# Patient Record
Sex: Male | Born: 1980 | Race: Black or African American | Hispanic: No | Marital: Single | Smoking: Current some day smoker
Health system: Southern US, Community
[De-identification: ages and names within clinical notes are randomized; demographics above are authoritative.]

## PROBLEM LIST (undated history)

## (undated) DIAGNOSIS — M549 Dorsalgia, unspecified: Secondary | ICD-10-CM

## (undated) DIAGNOSIS — I4891 Unspecified atrial fibrillation: Secondary | ICD-10-CM

---

## 2005-08-14 ENCOUNTER — Emergency Department: Payer: Self-pay | Admitting: Internal Medicine

## 2005-09-20 ENCOUNTER — Emergency Department: Payer: Self-pay | Admitting: Emergency Medicine

## 2007-04-24 ENCOUNTER — Emergency Department: Payer: Self-pay | Admitting: Emergency Medicine

## 2010-06-04 ENCOUNTER — Emergency Department: Payer: Self-pay | Admitting: Emergency Medicine

## 2010-06-13 ENCOUNTER — Emergency Department: Payer: Self-pay | Admitting: Internal Medicine

## 2010-08-30 ENCOUNTER — Observation Stay: Payer: Self-pay | Admitting: Internal Medicine

## 2011-09-26 ENCOUNTER — Emergency Department: Payer: Self-pay | Admitting: *Deleted

## 2012-06-27 ENCOUNTER — Emergency Department: Payer: Self-pay | Admitting: Emergency Medicine

## 2012-08-18 ENCOUNTER — Emergency Department: Payer: Self-pay | Admitting: Emergency Medicine

## 2013-10-15 ENCOUNTER — Emergency Department: Payer: Self-pay | Admitting: Emergency Medicine

## 2013-10-16 ENCOUNTER — Emergency Department: Payer: Self-pay | Admitting: Emergency Medicine

## 2015-07-16 ENCOUNTER — Encounter: Payer: Self-pay | Admitting: *Deleted

## 2015-07-16 ENCOUNTER — Emergency Department
Admission: EM | Admit: 2015-07-16 | Discharge: 2015-07-16 | Disposition: A | Discharge status: Home / Self Care | Payer: Self-pay | Attending: Emergency Medicine | Admitting: Emergency Medicine

## 2015-07-16 DIAGNOSIS — Y998 Other external cause status: Secondary | ICD-10-CM | POA: Insufficient documentation

## 2015-07-16 DIAGNOSIS — Z720 Tobacco use: Secondary | ICD-10-CM | POA: Insufficient documentation

## 2015-07-16 DIAGNOSIS — Y288XXA Contact with other sharp object, undetermined intent, initial encounter: Secondary | ICD-10-CM | POA: Insufficient documentation

## 2015-07-16 DIAGNOSIS — Y9301 Activity, walking, marching and hiking: Secondary | ICD-10-CM | POA: Insufficient documentation

## 2015-07-16 DIAGNOSIS — S61212A Laceration without foreign body of right middle finger without damage to nail, initial encounter: Secondary | ICD-10-CM | POA: Insufficient documentation

## 2015-07-16 DIAGNOSIS — Z23 Encounter for immunization: Secondary | ICD-10-CM | POA: Insufficient documentation

## 2015-07-16 DIAGNOSIS — S61214A Laceration without foreign body of right ring finger without damage to nail, initial encounter: Secondary | ICD-10-CM | POA: Insufficient documentation

## 2015-07-16 DIAGNOSIS — S61219A Laceration without foreign body of unspecified finger without damage to nail, initial encounter: Secondary | ICD-10-CM

## 2015-07-16 DIAGNOSIS — T07XXXA Unspecified multiple injuries, initial encounter: Secondary | ICD-10-CM

## 2015-07-16 DIAGNOSIS — Y92039 Unspecified place in apartment as the place of occurrence of the external cause: Secondary | ICD-10-CM | POA: Insufficient documentation

## 2015-07-16 DIAGNOSIS — S61210A Laceration without foreign body of right index finger without damage to nail, initial encounter: Secondary | ICD-10-CM | POA: Insufficient documentation

## 2015-07-16 HISTORY — DX: Dorsalgia, unspecified: M54.9

## 2015-07-16 MED ORDER — TETANUS-DIPHTH-ACELL PERTUSSIS 5-2.5-18.5 LF-MCG/0.5 IM SUSP
0.5000 mL | Freq: Once | INTRAMUSCULAR | Status: AC
  Administered 2015-07-16: 0.5 mL via INTRAMUSCULAR
  Filled 2015-07-16: qty 0.5

## 2015-07-16 MED ORDER — KETOROLAC TROMETHAMINE 10 MG PO TABS
20.0000 mg | ORAL_TABLET | Freq: Once | ORAL | Status: AC
  Administered 2015-07-16: 20 mg via ORAL
  Filled 2015-07-16: qty 2

## 2015-07-16 MED ORDER — LIDOCAINE HCL (PF) 1 % IJ SOLN
5.0000 mL | Freq: Once | INTRAMUSCULAR | Status: AC
  Administered 2015-07-16: 5 mL
  Filled 2015-07-16: qty 5

## 2015-07-16 NOTE — ED Notes (Signed)
Lac to right index and 3 rd finger from ash tray

## 2015-07-16 NOTE — Discharge Instructions (Signed)
Laceration Care, Adult A laceration is a cut that goes through all layers of the skin. The cut goes into the tissue beneath the skin. HOME CARE For stitches (sutures) or staples:  Keep the cut clean and dry.  If you have a bandage (dressing), change it at least once a day. Change the bandage if it gets wet or dirty, or as told by your doctor.  Wash the cut with soap and water 2 times a day. Rinse the cut with water. Pat it dry with a clean towel.  Put a thin layer of medicated cream on the cut as told by your doctor.  You may shower after the first 24 hours. Do not soak the cut in water until the stitches are removed.  Only take medicines as told by your doctor.  Have your stitches or staples removed as told by your doctor. For skin adhesive strips:  Keep the cut clean and dry.  Do not get the strips wet. You may take a bath, but be careful to keep the cut dry.  If the cut gets wet, pat it dry with a clean towel.  The strips will fall off on their own. Do not remove the strips that are still stuck to the cut. For wound glue:  You may shower or take baths. Do not soak or scrub the cut. Do not swim. Avoid heavy sweating until the glue falls off on its own. After a shower or bath, pat the cut dry with a clean towel.  Do not put medicine on your cut until the glue falls off.  If you have a bandage, do not put tape over the glue.  Avoid lots of sunlight or tanning lamps until the glue falls off. Put sunscreen on the cut for the first year to reduce your scar.  The glue will fall off on its own. Do not pick at the glue. You may need a tetanus shot if:  You cannot remember when you had your last tetanus shot.  You have never had a tetanus shot. If you need a tetanus shot and you choose not to have one, you may get tetanus. Sickness from tetanus can be serious. GET HELP RIGHT AWAY IF:   Your pain does not get better with medicine.  Your arm, hand, leg, or foot loses feeling  (numbness) or changes color.  Your cut is bleeding.  Your joint feels weak, or you cannot use your joint.  You have painful lumps on your body.  Your cut is red, puffy (swollen), or painful.  You have a red line on the skin near the cut.  You have yellowish-white fluid (pus) coming from the cut.  You have a fever.  You have a bad smell coming from the cut or bandage.  Your cut breaks open before or after stitches are removed.  You notice something coming out of the cut, such as wood or glass.  You cannot move a finger or toe. MAKE SURE YOU:   Understand these instructions.  Will watch your condition.  Will get help right away if you are not doing well or get worse. Document Released: 05/25/2008 Document Revised: 02/29/2012 Document Reviewed: 06/02/2011 [facility] Patient Information 2015 [place], [place]. This information is not intended to replace advice given to you by your health care provider. Make sure you discuss any questions you have with your health care provider.   Keep your wound clean, dry, and covered.  Wash only with soap & water. Return to the  ED in 10-12 days for suture removal.

## 2015-07-16 NOTE — ED Notes (Signed)
Lac to right fingers

## 2015-07-16 NOTE — ED Provider Notes (Signed)
[facility] Emergency Department Provider Note ____________________________________________  Time seen: 0800  I have reviewed the triage vital signs and the nursing notes.  HISTORY  Chief Complaint  Extremity Laceration  HPI Darryl Flynn is a 34 y.o. male reports to the ED for evaluation management of multiple lacerations sustained to the right fingers. He describes he was walking into his apartment, when he inadvertently slipped on his son's toy which was in the floor. This caused him to lunch forward and as he tried to grab the the bar, he knocked off a large glass ashtray. This actually shattered causing multiple cuts to his right index, middle, and ring fingers.He is unsure of his tetanus status at this time.  Past Medical History  Diagnosis Date  . Back ache    There are no active problems to display for this patient.  History reviewed. No pertinent past surgical history.  No current outpatient prescriptions on file.  Allergies Review of patient's allergies indicates no known allergies.  No family history on file.  Social History History  Substance Use Topics  . Smoking status: Current Every Day Smoker  . Smokeless tobacco: Not on file  . Alcohol Use: No   Review of Systems  Constitutional: Negative for fever. Eyes: Negative for visual changes. ENT: Negative for sore throat. Cardiovascular: Negative for chest pain. Respiratory: Negative for shortness of breath. Gastrointestinal: Negative for abdominal pain, vomiting and diarrhea. Genitourinary: Negative for dysuria. Musculoskeletal: Negative for back pain. Skin: Negative for rash. Multiple lacerations as above.  Neurological: Negative for headaches, focal weakness or numbness. ____________________________________________  PHYSICAL EXAM:  VITAL SIGNS: ED Triage Vitals  Enc Vitals Group     BP 07/16/15 0743 141/115 mmHg     Pulse Rate 07/16/15 0743 77     Resp 07/16/15 0743 20    Temp 07/16/15 0743 98.6 F (37 C)     Temp Source 07/16/15 0743 Oral     SpO2 07/16/15 0743 100 %     Weight 07/16/15 0743 150 lb (68.04 kg)     Height 07/16/15 0743  (1.753 m)     Head Cir --      Peak Flow --      Pain Score 07/16/15 0744 10     Pain Loc --      Pain Edu? --      Excl. in GC? --    Constitutional: Alert and oriented. Well appearing and in no distress. Eyes: Conjunctivae are normal. PERRL. Normal extraocular movements. ENT   Head: Normocephalic and atraumatic.   Nose: No congestion/rhinnorhea.   Mouth/Throat: Mucous membranes are moist.   Neck: Supple. No thyromegaly. Hematological/Lymphatic/Immunilogical: No cervical lymphadenopathy. Cardiovascular: Normal rate, regular rhythm.  Respiratory: Normal respiratory effort. No wheezes/rales/rhonchi. Gastrointestinal: Soft and nontender. No distention. Musculoskeletal: Nontender with normal range of motion in right hand. Normal composite fist.  Neurologic:  Normal gross sensation. Normal gait without ataxia. Normal speech and language. No gross focal neurologic deficits are appreciated. Skin:  Skin is warm, dry and intact. No rash noted. Multiple laceration to the tips of the right index and ring fingers. Bleeding controlled.  Psychiatric: Mood and affect are normal. Patient exhibits appropriate insight and judgment. ____________________________________________  PROCEDURES Tdap booster  LACERATION REPAIR Performed by: Lissa Hoard Authorized by: Lissa Hoard Consent: Verbal consent obtained. Risks and benefits: risks, benefits and alternatives were discussed Consent given by: patient Patient identity confirmed: provided demographic data Prepped and Draped in normal sterile fashion  Wound explored  Laceration Location: Right index/ring fingers  Laceration Length: index 1cm & 1cm / ring 2cm  No Foreign Bodies seen or palpated  Anesthesia: digital block to right 2nd &  4th digits  Local anesthetic: lidocaine 1% w/o epinephrine  Anesthetic total: 5 ml  Irrigation method: syringe  Amount of cleaning: standard  Skin closure: 5-0 nylon  Number of sutures: index = 4 / ring = 1  Technique: interrupted  Patient tolerance: 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 Unwilling to allow for direct infiltration after sub-optimal digital block anesthesia. The procedure was completed with no immediate complications. ____________________________________________  INITIAL IMPRESSION / ASSESSMENT AND PLAN / ED COURSE  Laceration repair using sutures for multiple lac to the fingers of the right hand. Wound care instructions given.  Return to the ED in 10-12 days for suture removal.  Return as needed for wound checks. ____________________________________________  FINAL CLINICAL IMPRESSION(S) / ED DIAGNOSES  Final diagnoses:  Multiple lacerations  Laceration of finger of right hand, initial encounter     Lissa Hoard, PA-C 07/16/15 1610  Phineas Semen, MD 07/16/15 1231

## 2015-07-31 ENCOUNTER — Emergency Department
Admission: EM | Admit: 2015-07-31 | Discharge: 2015-07-31 | Disposition: A | Discharge status: Home / Self Care | Payer: Self-pay | Attending: Emergency Medicine | Admitting: Emergency Medicine

## 2015-07-31 ENCOUNTER — Encounter: Payer: Self-pay | Admitting: Emergency Medicine

## 2015-07-31 DIAGNOSIS — Z720 Tobacco use: Secondary | ICD-10-CM | POA: Insufficient documentation

## 2015-07-31 DIAGNOSIS — Z4802 Encounter for removal of sutures: Secondary | ICD-10-CM | POA: Insufficient documentation

## 2015-07-31 NOTE — ED Notes (Signed)
Pt arrives with stitches to his left finger, index, PA removed all 5 stitches

## 2015-07-31 NOTE — Discharge Instructions (Signed)

## 2015-07-31 NOTE — ED Notes (Signed)
Here for suture removal

## 2015-07-31 NOTE — ED Provider Notes (Signed)
[facility] Emergency Department Provider Note  ____________________________________________  Time seen: Approximately 11:43 AM  I have reviewed the triage vital signs and the nursing notes.   HISTORY  Chief Complaint Suture / Staple Removal    HPI Darryl Flynn is a 34 y.o. male patient here for suture removal right index and ring finger. Patient denies any signs of secondary infection. He denies any pain. Patient denies any loss of sensation or loss of function of the fingers. Sutures were placed   Past Medical History  Diagnosis Date  . Back ache     There are no active problems to display for this patient.   History reviewed. No pertinent past surgical history.  No current outpatient prescriptions on file.  Allergies Review of patient's allergies indicates no known allergies.  No family history on file.  Social History Social History  Substance Use Topics  . Smoking status: Current Every Day Smoker  . Smokeless tobacco: None  . Alcohol Use: No    Review of Systems Constitutional: No fever/chills Eyes: No visual changes. ENT: No sore throat. Cardiovascular: Denies chest pain. Respiratory: Denies shortness of breath. Gastrointestinal: No abdominal pain.  No nausea, no vomiting.  No diarrhea.  No constipation. Genitourinary: Negative for dysuria. Musculoskeletal: Negative for back pain. Skin: Healed laceration second for finger right hand. Neurological: Negative for headaches, focal weakness or numbness. {10-point ROS otherwise negative.  ____________________________________________   PHYSICAL EXAM:  VITAL SIGNS: ED Triage Vitals  Enc Vitals Group     BP 07/31/15 1115 140/78 mmHg     Pulse Rate 07/31/15 1115 77     Resp 07/31/15 1115 20     Temp 07/31/15 1115 98 F (36.7 C)     Temp Source 07/31/15 1115 Oral     SpO2 07/31/15 1115 99 %     Weight 07/31/15 1115 150 lb (68.04 kg)     Height 07/31/15 1115  (1.753 m)      Head Cir --      Peak Flow --      Pain Score --      Pain Loc --      Pain Edu? --      Excl. in GC? --     Constitutional: Alert and oriented. Well appearing and in no acute distress. Eyes: Conjunctivae are normal. PERRL. EOMI. Head: Atraumatic. Nose: No congestion/rhinnorhea. Mouth/Throat: Mucous membranes are moist.  Oropharynx non-erythematous. Neck: No stridor.  No cervical spine tenderness to palpation. Hematological/Lymphatic/Immunilogical: No cervical lymphadenopathy. Cardiovascular: Normal rate, regular rhythm. Grossly normal heart sounds.  Good peripheral circulation. Respiratory: Normal respiratory effort.  No retractions. Lungs CTAB. Gastrointestinal: Soft and nontender. No distention. No abdominal bruits. No CVA tenderness. Musculoskeletal: No lower extremity tenderness nor edema.  No joint effusions. Neurologic:  Normal speech and language. No gross focal neurologic deficits are appreciated. No gait instability. Skin:  Healed laceration second fourth digit right hand. Psychiatric: Mood and affect are normal. Speech and behavior are normal.  ____________________________________________   LABS (all labs ordered are listed, but only abnormal results are displayed)  Labs Reviewed - No data to display ____________________________________________  EKG   ____________________________________________  RADIOLOGY   ____________________________________________   PROCEDURES  Procedure(s) performed: None  Critical Care performed: No  ____________________________________________   INITIAL IMPRESSION / ASSESSMENT AND PLAN / ED COURSE  Pertinent labs & imaging results that were available during my care of the patient were reviewed by me and considered in my medical decision making (see chart  for details).  Healed laceration second finger right hand. Suture removal without incident. Patient given home care instructions and advised to follow-up his condition  worsens. ____________________________________________   FINAL CLINICAL IMPRESSION(S) / ED DIAGNOSES  Final diagnoses:  Encounter for removal of sutures      Joni Reining, PA-C 07/31/15 1147  Governor Rooks, MD 07/31/15 [phone]

## 2015-10-19 ENCOUNTER — Encounter: Payer: Self-pay | Admitting: Emergency Medicine

## 2015-10-19 ENCOUNTER — Emergency Department
Admission: EM | Admit: 2015-10-19 | Discharge: 2015-10-19 | Disposition: A | Discharge status: Home / Self Care | Payer: Self-pay | Attending: Emergency Medicine | Admitting: Emergency Medicine

## 2015-10-19 DIAGNOSIS — K0889 Other specified disorders of teeth and supporting structures: Secondary | ICD-10-CM | POA: Insufficient documentation

## 2015-10-19 DIAGNOSIS — R1012 Left upper quadrant pain: Secondary | ICD-10-CM | POA: Insufficient documentation

## 2015-10-19 DIAGNOSIS — K029 Dental caries, unspecified: Secondary | ICD-10-CM | POA: Insufficient documentation

## 2015-10-19 DIAGNOSIS — Z720 Tobacco use: Secondary | ICD-10-CM | POA: Insufficient documentation

## 2015-10-19 DIAGNOSIS — K0381 Cracked tooth: Secondary | ICD-10-CM | POA: Insufficient documentation

## 2015-10-19 MED ORDER — PENICILLIN V POTASSIUM 250 MG PO TABS: 250.0000 mg | ORAL_TABLET | Freq: Four times a day (QID) | ORAL | Status: DC

## 2015-10-19 MED ORDER — TRAMADOL HCL 50 MG PO TABS: 50.0000 mg | ORAL_TABLET | Freq: Four times a day (QID) | ORAL | Status: DC | PRN

## 2015-10-19 NOTE — ED Notes (Signed)
Right lower dental pain.  Initially happened while in jail about one month ago.  Treated with antibiotics, so pain improved.  Pain returned yesterday. Also c/o left upper abdominal pain that is "like a gas bubble" that "takes (patient's breath away)" x 2 days.

## 2015-10-20 NOTE — ED Provider Notes (Signed)
[facility] Emergency Department Provider Note  ____________________________________________  Time seen: On arrival  I have reviewed the triage vital signs and the nursing notes.   HISTORY  Chief Complaint Dental Pain and Abdominal Pain    HPI Darryl Flynn is a 34 y.o. male who presents with complains of dental pain. He states his pain is primarily in the right lower posterior jaw but occasionally has pain in the left jaw as well. No fevers chills. No intraoral swelling. He also complains of occasionally having a twinge in his left upper abdomen but he denies any pain today. No nausea no vomiting no diarrhea.     Past Medical History  Diagnosis Date  . Back ache     There are no active problems to display for this patient.   History reviewed. No pertinent past surgical history.  Current Outpatient Rx  Name  Route  Sig  Dispense  Refill  . penicillin v potassium (VEETID) 250 MG tablet   Oral   Take 1 tablet (250 mg total) by mouth 4 (four) times daily.   28 tablet   0   . traMADol (ULTRAM) 50 MG tablet   Oral   Take 1 tablet (50 mg total) by mouth every 6 (six) hours as needed.   20 tablet   0     Allergies Review of patient's allergies indicates no known allergies.  No family history on file.  Social History Social History  Substance Use Topics  . Smoking status: Current Every Day Smoker  . Smokeless tobacco: None  . Alcohol Use: No    Review of Systems  Constitutional: Negative for fever. Eyes: Negative for visual changes. ENT: Negative for sore throat   Genitourinary: Negative for dysuria. Musculoskeletal: Negative for back pain. Skin: Negative for rash. Neurological: Negative for headaches or focal weakness   ____________________________________________   PHYSICAL EXAM:  VITAL SIGNS: ED Triage Vitals  Enc Vitals Group     BP 10/19/15 2206 127/75 mmHg     Pulse Rate 10/19/15 2203 86     Resp 10/19/15 2206 20      Temp 10/19/15 2203 98.5 F (36.9 C)     Temp Source 10/19/15 2203 Oral     SpO2 10/19/15 2206 98 %     Weight 10/19/15 2214 150 lb (68.04 kg)     Height 10/19/15 2214 5\' 9"  (1.753 m)     Head Cir --      Peak Flow --      Pain Score 10/19/15 2215 9     Pain Loc --      Pain Edu? --      Excl. in GC? --      Constitutional: Alert and oriented. Well appearing and in no distress. Eyes: Conjunctivae are normal.  ENT   Head: Normocephalic and atraumatic.   Mouth/Throat: Mucous membranes are moist. Positive dental caries, no dental abscesses. Fractured tooth noted at the site of pain Cardiovascular: Normal rate, regular rhythm.  Respiratory: Normal respiratory effort without tachypnea nor retractions.  Gastrointestinal: Soft and non-tender in all quadrants. No distention. There is no CVA tenderness.  Neurologic:  Normal speech and language. No gross focal neurologic deficits are appreciated. Skin:  Skin is warm, dry and intact. No rash noted. Psychiatric: Mood and affect are normal. Patient exhibits appropriate insight and judgment.  ____________________________________________    LABS (pertinent positives/negatives)  Labs Reviewed - No data to display  ____________________________________________     ____________________________________________  RADIOLOGY I have personally reviewed any xrays that were ordered on this patient: None  ____________________________________________   PROCEDURES  Procedure(s) performed: none   ____________________________________________   INITIAL IMPRESSION / ASSESSMENT AND PLAN / ED COURSE  Pertinent labs & imaging results that were available during my care of the patient were reviewed by me and considered in my medical decision making (see chart for details).  Penicillin and analgesics provided. Benign abdominal exam. Follow-up with dentist  ____________________________________________   FINAL CLINICAL  IMPRESSION(S) / ED DIAGNOSES  Final diagnoses:  Pain, dental     Jene Every, MD 10/20/15 [phone]

## 2016-02-07 ENCOUNTER — Emergency Department
Admission: EM | Admit: 2016-02-07 | Discharge: 2016-02-07 | Disposition: A | Discharge status: Home / Self Care | Payer: Self-pay | Attending: Emergency Medicine | Admitting: Emergency Medicine

## 2016-02-07 ENCOUNTER — Encounter: Payer: Self-pay | Admitting: Emergency Medicine

## 2016-02-07 DIAGNOSIS — K047 Periapical abscess without sinus: Secondary | ICD-10-CM | POA: Insufficient documentation

## 2016-02-07 DIAGNOSIS — Z792 Long term (current) use of antibiotics: Secondary | ICD-10-CM | POA: Insufficient documentation

## 2016-02-07 DIAGNOSIS — K032 Erosion of teeth: Secondary | ICD-10-CM | POA: Insufficient documentation

## 2016-02-07 DIAGNOSIS — F17200 Nicotine dependence, unspecified, uncomplicated: Secondary | ICD-10-CM | POA: Insufficient documentation

## 2016-02-07 DIAGNOSIS — K029 Dental caries, unspecified: Secondary | ICD-10-CM | POA: Insufficient documentation

## 2016-02-07 MED ORDER — AMOXICILLIN 875 MG PO TABS: 875.0000 mg | ORAL_TABLET | Freq: Two times a day (BID) | ORAL | Status: DC

## 2016-02-07 MED ORDER — LIDOCAINE-EPINEPHRINE 2 %-1:100000 IJ SOLN
1.7000 mL | Freq: Once | INTRAMUSCULAR | Status: DC
  Filled 2016-02-07: qty 1.7

## 2016-02-07 MED ORDER — MAGIC MOUTHWASH W/LIDOCAINE: 5.0000 mL | Freq: Four times a day (QID) | ORAL | Status: DC

## 2016-02-07 NOTE — ED Provider Notes (Signed)
[facility] Emergency Department Provider Note  ____________________________________________  Time seen: Approximately 2:46 PM  I have reviewed the triage vital signs and the nursing notes.   HISTORY  Chief Complaint Dental Pain    HPI Darryl Flynn is a 35 y.o. male who presents emergency department complaining of dental pain. Patient states that he has swelling to the bilateral lower gums that is worse on the left side. Patient states that he has had abscesses before and states that this feels the same. Patient has known poor dentition but states that he does not have a current dentist. Patient states that he feels bad and had a tactile fever last night. Patient has no difficulty breathing or swallowing. Endorses bilateral lower dental pain.   Past Medical History  Diagnosis Date  . Back ache     There are no active problems to display for this patient.   History reviewed. No pertinent past surgical history.  Current Outpatient Rx  Name  Route  Sig  Dispense  Refill  . amoxicillin (AMOXIL) 875 MG tablet   Oral   Take 1 tablet (875 mg total) by mouth 2 (two) times daily.   14 tablet   0   . magic mouthwash w/lidocaine SOLN   Oral   Take 5 mLs by mouth 4 (four) times daily.   240 mL   0     Dispense in a 1/1/1/1 ratio. Use lidocaine, diphen ...   . penicillin v potassium (VEETID) 250 MG tablet   Oral   Take 1 tablet (250 mg total) by mouth 4 (four) times daily.   28 tablet   0   . traMADol (ULTRAM) 50 MG tablet   Oral   Take 1 tablet (50 mg total) by mouth every 6 (six) hours as needed.   20 tablet   0     Allergies Review of patient's allergies indicates no known allergies.  No family history on file.  Social History Social History  Substance Use Topics  . Smoking status: Current Every Day Smoker  . Smokeless tobacco: None  . Alcohol Use: No     Review of Systems  Constitutional: Endorses subjective tactile  fever/chills ENT: No sore throat. Endorses left lower and right lower dental pain. Cardiovascular: no chest pain. Respiratory: no cough. No SOB. Skin: Negative for rash. Neurological: Negative for headaches, focal weakness or numbness. 10-point ROS otherwise negative.  ____________________________________________   PHYSICAL EXAM:  VITAL SIGNS: ED Triage Vitals  Enc Vitals Group     BP 02/07/16 1425 136/91 mmHg     Pulse Rate 02/07/16 1425 70     Resp 02/07/16 1425 18     Temp 02/07/16 1425 98.3 F (36.8 C)     Temp Source 02/07/16 1425 Oral     SpO2 02/07/16 1425 98 %     Weight 02/07/16 1425 147 lb (66.679 kg)     Height 02/07/16 1425  (1.753 m)     Head Cir --      Peak Flow --      Pain Score 02/07/16 1424 8     Pain Loc --      Pain Edu? --      Excl. in GC? --      Constitutional: Alert and oriented. Well appearing and in no acute distress. Eyes: Conjunctivae are normal. PERRL. EOMI. Head: Atraumatic. ENT:      Ears:       Nose: No congestion/rhinnorhea.  Mouth/Throat: Mucous membranes are moist. Poor dentition noted throughout with multiple caries and erosions. There is edema and erythema to the left gums below teeth #17, 18 and 19. No drainage noted. Neck: No stridor.   Hematological/Lymphatic/Immunilogical: No cervical lymphadenopathy. Cardiovascular: Normal rate, regular rhythm. Normal S1 and S2.  Good peripheral circulation. Respiratory: Normal respiratory effort without tachypnea or retractions. Lungs CTAB. Neurologic:  Normal speech and language. No gross focal neurologic deficits are appreciated.  Skin:  Skin is warm, dry and intact. No rash noted. Psychiatric: Mood and affect are normal. Speech and behavior are normal. Patient exhibits appropriate insight and judgement.   ____________________________________________   LABS (all labs ordered are listed, but only abnormal results are displayed)  Labs Reviewed - No data to  display ____________________________________________  EKG   ____________________________________________  RADIOLOGY   No results found.  ____________________________________________    PROCEDURES  Procedure(s) performed:   Dental block. Dental block is performed using 1.7 mL of lidocaine with epi. This is administered into the lower dentition with good relief. Patient tolerated procedure well.    Medications  lidocaine-EPINEPHrine (XYLOCAINE W/EPI) 2 %-1:100000 (with pres) injection 1.7 mL (not administered)     ____________________________________________   INITIAL IMPRESSION / ASSESSMENT AND PLAN / ED COURSE  Pertinent labs & imaging results that were available during my care of the patient were reviewed by me and considered in my medical decision making (see chart for details).  Patient's diagnosis is consistent with dental abscess left lower dentition.. Patient will be discharged home with prescriptions for antibiotics and magic mouthwash. Patient is to follow up with [facility] dental clinic if symptoms persist past this treatment course. Patient is given ED precautions to return to the ED for any worsening or new symptoms.     ____________________________________________  FINAL CLINICAL IMPRESSION(S) / ED DIAGNOSES  Final diagnoses:  Dental abscess      NEW MEDICATIONS STARTED DURING THIS VISIT:  New Prescriptions   AMOXICILLIN (AMOXIL) 875 MG TABLET    Take 1 tablet (875 mg total) by mouth 2 (two) times daily.   MAGIC MOUTHWASH W/LIDOCAINE SOLN    Take 5 mLs by mouth 4 (four) times daily.        Delorise Royals Cuthriell, PA-C 02/07/16 1456  Delorise Royals Cuthriell, PA-C 02/07/16 1457  Jennye Moccasin, MD 02/07/16 [phone]

## 2016-02-07 NOTE — ED Notes (Signed)
States he developed swelling to bilateral gum and fever last pm

## 2016-02-07 NOTE — Discharge Instructions (Signed)
Dental Abscess °A dental abscess is a collection of pus in or around a tooth. °CAUSES °This condition is caused by a bacterial infection around the root of the tooth that involves the inner part of the tooth (pulp). It may result from: °· Severe tooth decay. °· Trauma to the tooth that allows bacteria to enter into the pulp, such as a broken or chipped tooth. °· Severe gum disease around a tooth. °SYMPTOMS °Symptoms of this condition include: °· Severe pain in and around the infected tooth. °· Swelling and redness around the infected tooth, in the mouth, or in the face. °· Tenderness. °· Pus drainage. °· Bad breath. °· Bitter taste in the mouth. °· Difficulty swallowing. °· Difficulty opening the mouth. °· Nausea. °· Vomiting. °· Chills. °· Swollen neck glands. °· Fever. °DIAGNOSIS °This condition is diagnosed with examination of the infected tooth. During the exam, your dentist may tap on the infected tooth. Your dentist will also ask about your medical and dental history and may order X-rays. °TREATMENT °This condition is treated by eliminating the infection. This may be done with: °· Antibiotic medicine. °· A root canal. This may be performed to save the tooth. °· Pulling (extracting) the tooth. This may also involve draining the abscess. This is done if the tooth cannot be saved. °HOME CARE INSTRUCTIONS °· Take medicines only as directed by your dentist. °· If you were prescribed antibiotic medicine, finish all of it even if you start to feel better. °· Rinse your mouth (gargle) often with salt water to relieve pain or swelling. °· Do not drive or operate heavy machinery while taking pain medicine. °· Do not apply heat to the outside of your mouth. °· Keep all follow-up visits as directed by your dentist. This is important. °SEEK MEDICAL CARE IF: °· Your pain is worse and is not helped by medicine. °SEEK IMMEDIATE MEDICAL CARE IF: °· You have a fever or chills. °· Your symptoms suddenly get worse. °· You have a  very bad headache. °· You have problems breathing or swallowing. °· You have trouble opening your mouth. °· You have swelling in your neck or around your eye. °  °This information is not intended to replace advice given to you by your health care provider. Make sure you discuss any questions you have with your health care provider. °  °Document Released: 12/07/2005 Document Revised: 04/23/2015 Document Reviewed: 12/04/2014 °Elsevier Interactive Patient Education ©2016 [employer] ° °Dental Care and Dentist Visits °Dental care supports good overall health. Regular dental visits can also help you avoid dental pain, bleeding, infection, and other more serious health problems in the future. It is important to keep the mouth healthy because diseases in the teeth, gums, and other oral tissues can spread to other areas of the body. Some problems, such as diabetes, heart disease, and pre-term labor have been associated with poor oral health.  °See your dentist every 6 months. If you experience emergency problems such as a toothache or broken tooth, go to the dentist right away. If you see your dentist regularly, you may catch problems early. It is easier to be treated for problems in the early stages.  °WHAT TO EXPECT AT A DENTIST VISIT  °Your dentist will look for many common oral health problems and recommend proper treatment. At your regular dental visit, you can expect: °· Gentle cleaning of the teeth and gums. This includes scraping and polishing. This helps to remove the sticky substance around the teeth and gums (  plaque). Plaque forms in the mouth shortly after eating. Over time, plaque hardens on the teeth as tartar. If tartar is not removed regularly, it can cause problems. Cleaning also helps remove stains. °· Periodic X-rays. These pictures of the teeth and supporting bone will help your dentist assess the health of your teeth. °· Periodic fluoride treatments. Fluoride is a natural mineral shown to help  strengthen teeth. Fluoride treatment involves applying a fluoride gel or varnish to the teeth. It is most commonly done in children. °· Examination of the mouth, tongue, jaws, teeth, and gums to look for any oral health problems, such as: °¨ Cavities (dental caries). This is decay on the tooth caused by plaque, sugar, and acid in the mouth. It is best to catch a cavity when it is small. °¨ Inflammation of the gums caused by plaque buildup (gingivitis). °¨ Problems with the mouth or malformed or misaligned teeth. °¨ Oral cancer or other diseases of the soft tissues or jaws.  °KEEP YOUR TEETH AND GUMS HEALTHY °For healthy teeth and gums, follow these general guidelines as well as your dentist's specific advice: °· Have your teeth professionally cleaned at the dentist every 6 months. °· Brush twice daily with a fluoride toothpaste. °· Floss your teeth daily.  °· Ask your dentist if you need fluoride supplements, treatments, or fluoride toothpaste. °· Eat a healthy diet. Reduce foods and drinks with added sugar. °· Avoid smoking. °TREATMENT FOR ORAL HEALTH PROBLEMS °If you have oral health problems, treatment varies depending on the conditions present in your teeth and gums. °· Your caregiver will most likely recommend good oral hygiene at each visit. °· For cavities, gingivitis, or other oral health disease, your caregiver will perform a procedure to treat the problem. This is typically done at a separate appointment. Sometimes your caregiver will refer you to another dental specialist for specific tooth problems or for surgery. °SEEK IMMEDIATE DENTAL CARE IF: °· You have pain, bleeding, or soreness in the gum, tooth, jaw, or mouth area. °· A permanent tooth becomes loose or separated from the gum socket. °· You experience a blow or injury to the mouth or jaw area. °  °This information is not intended to replace advice given to you by your health care provider. Make sure you discuss any questions you have with your  health care provider. °  °Document Released: 08/19/2011 Document Revised: 02/29/2012 Document Reviewed: 08/19/2011 °Elsevier Interactive Patient Education ©2016 [employer] ° °

## 2016-02-11 ENCOUNTER — Encounter: Payer: Self-pay | Admitting: Emergency Medicine

## 2016-02-11 ENCOUNTER — Emergency Department
Admission: EM | Admit: 2016-02-11 | Discharge: 2016-02-11 | Disposition: A | Discharge status: Home / Self Care | Payer: Self-pay | Attending: Emergency Medicine | Admitting: Emergency Medicine

## 2016-02-11 DIAGNOSIS — K029 Dental caries, unspecified: Secondary | ICD-10-CM | POA: Insufficient documentation

## 2016-02-11 DIAGNOSIS — Z792 Long term (current) use of antibiotics: Secondary | ICD-10-CM | POA: Insufficient documentation

## 2016-02-11 DIAGNOSIS — Z79899 Other long term (current) drug therapy: Secondary | ICD-10-CM | POA: Insufficient documentation

## 2016-02-11 DIAGNOSIS — F17200 Nicotine dependence, unspecified, uncomplicated: Secondary | ICD-10-CM | POA: Insufficient documentation

## 2016-02-11 DIAGNOSIS — K0889 Other specified disorders of teeth and supporting structures: Secondary | ICD-10-CM | POA: Insufficient documentation

## 2016-02-11 MED ORDER — IBUPROFEN 800 MG PO TABS
800.0000 mg | ORAL_TABLET | Freq: Once | ORAL | Status: AC
  Administered 2016-02-11: 800 mg via ORAL
  Filled 2016-02-11: qty 1

## 2016-02-11 MED ORDER — IBUPROFEN 800 MG PO TABS: 800.0000 mg | ORAL_TABLET | Freq: Three times a day (TID) | ORAL | Status: DC | PRN

## 2016-02-11 MED ORDER — CEPHALEXIN 500 MG PO CAPS
500.0000 mg | ORAL_CAPSULE | Freq: Once | ORAL | Status: AC
  Administered 2016-02-11: 500 mg via ORAL
  Filled 2016-02-11: qty 1

## 2016-02-11 NOTE — ED Provider Notes (Signed)
[facility] Emergency Department Provider Note  ____________________________________________  Time seen: Approximately 6:18 AM  I have reviewed the triage vital signs and the nursing notes.   HISTORY  Chief Complaint Dental Pain    HPI Darryl Flynn is a 35 y.o. male who comes into the hospital today complaining of an abscess in his gums. The patient reports that last night his gums were swollen but now they've gone down. He wants to know if there is something that we can do for his gums. The patient reports that he was here on Thursday and prescribed some amoxicillin and magic mouthwash. The patient has not yet filled the prescription reports he will do it today. The patient reports that he has had some swelling that seems to be worse at nighttime. He reports that his teeth feel like they're cutting into his gums. He has not had the time of the money to fill his prescription nor to see the dentist. He was told to follow-up at [facility] dental clinic. He reports that right now the pain is not bad but still rates it as a 9 out of 10 in intensity and reports he feels as though his heart beat is in his jaw. He has some swelling in his neck on the right and in his jaw on the left. The patient has not taken anything at home for the pain.   Past Medical History  Diagnosis Date  . Back ache     There are no active problems to display for this patient.   History reviewed. No pertinent past surgical history.  Current Outpatient Rx  Name  Route  Sig  Dispense  Refill  . amoxicillin (AMOXIL) 875 MG tablet   Oral   Take 1 tablet (875 mg total) by mouth 2 (two) times daily.   14 tablet   0   . ibuprofen (ADVIL,MOTRIN) 800 MG tablet   Oral   Take 1 tablet (800 mg total) by mouth every 8 (eight) hours as needed.   15 tablet   0   . magic mouthwash w/lidocaine SOLN   Oral   Take 5 mLs by mouth 4 (four) times daily.   240 mL   0     Dispense in a 1/1/1/1 ratio. Use  lidocaine, diphen ...   . penicillin v potassium (VEETID) 250 MG tablet   Oral   Take 1 tablet (250 mg total) by mouth 4 (four) times daily.   28 tablet   0   . traMADol (ULTRAM) 50 MG tablet   Oral   Take 1 tablet (50 mg total) by mouth every 6 (six) hours as needed.   20 tablet   0     Allergies Review of patient's allergies indicates no known allergies.  No family history on file.  Social History Social History  Substance Use Topics  . Smoking status: Current Every Day Smoker  . Smokeless tobacco: None  . Alcohol Use: No    Review of Systems Constitutional: No fever/chills Eyes: No visual changes. ENT: Dental pain, No sore throat. Cardiovascular: Denies chest pain. Respiratory: Denies shortness of breath. Gastrointestinal: No abdominal pain.  No nausea, no vomiting.  No diarrhea.  No constipation. Genitourinary: Negative for dysuria. Musculoskeletal: Negative for back pain. Skin: Negative for rash. Neurological: Negative for headaches, focal weakness or numbness.  10-point ROS otherwise negative.  ____________________________________________   PHYSICAL EXAM:  VITAL SIGNS: ED Triage Vitals  Enc Vitals Group     BP 02/11/16 0110  120/82 mmHg     Pulse Rate 02/11/16 0110 69     Resp 02/11/16 0110 20     Temp 02/11/16 0110 98.1 F (36.7 C)     Temp Source 02/11/16 0110 Oral     SpO2 02/11/16 0110 99 %     Weight 02/11/16 0110 148 lb (67.132 kg)     Height 02/11/16 0110  (1.753 m)     Head Cir --      Peak Flow --      Pain Score 02/11/16 0130 9     Pain Loc --      Pain Edu? --      Excl. in GC? --     Constitutional: Alert and oriented. Well appearing and in mild distress. Eyes: Conjunctivae are normal. PERRL. EOMI. Head: Atraumatic. Nose: No congestion/rhinnorhea. Mouth/Throat: Mucous membranes are moist.  Multiple teeth with dental decay and some mild swelling to the patient's mandibular gums, mild soft tissue swelling to the left  jaw Hematological/Lymphatic/Immunilogical: I sided cervical lymphadenopathy Cardiovascular: Normal rate, regular rhythm. Grossly normal heart sounds.  Good peripheral circulation. Respiratory: Normal respiratory effort.  No retractions. Lungs CTAB. Gastrointestinal: Soft and nontender. No distention. Positive bowel sounds Musculoskeletal: No lower extremity tenderness nor edema.   Neurologic:  Normal speech and language.  Skin:  Skin is warm, dry and intact.  Psychiatric: Mood and affect are normal.   ____________________________________________   LABS (all labs ordered are listed, but only abnormal results are displayed)  Labs Reviewed - No data to display ____________________________________________  EKG  None ____________________________________________  RADIOLOGY  None ____________________________________________   PROCEDURES  Procedure(s) performed: None  Critical Care performed: No  ____________________________________________   INITIAL IMPRESSION / ASSESSMENT AND PLAN / ED COURSE  Pertinent labs & imaging results that were available during my care of the patient were reviewed by me and considered in my medical decision making (see chart for details).  This is a 35 year old male who comes into the hospital today with some dental pain. The patient was seen on Thursday but did not take the medication that he was post take to help with this infection. The patient will be given some IV Profen and a dose of Keflex and he'll be discharged home to follow-up with the dental clinic. I encouraged the patient and informed him that we would not be able to pull any of his teeth he has to follow up if he wants his symptoms to improve. The patient will be discharged home. ____________________________________________   FINAL CLINICAL IMPRESSION(S) / ED DIAGNOSES  Final diagnoses:  Pain, dental      Rebecka Apley, MD 02/11/16 [phone]

## 2016-02-11 NOTE — Discharge Instructions (Signed)
PLEASE FOLLOW UP WITH THE [facility] DENTAL CLINIC TO HAVE YOUR TEETH EVALUATED AND REPAIRED. YOUR SYMPTOMS WILL NOT IMPROVE WITHOUT A DENTISTS EVALUATION AND TREATMENT.

## 2016-02-11 NOTE — ED Notes (Signed)
Patient ambulatory to triage with steady gait, without difficulty or distress noted; pt reports pain to gums; st rx antibiotic but has not gotten it filled yet (was seen Thursday)

## 2016-02-14 ENCOUNTER — Encounter: Payer: Self-pay | Admitting: Emergency Medicine

## 2016-02-14 ENCOUNTER — Emergency Department
Admission: EM | Admit: 2016-02-14 | Discharge: 2016-02-14 | Disposition: A | Discharge status: Home / Self Care | Payer: Self-pay | Attending: Student | Admitting: Student

## 2016-02-14 DIAGNOSIS — Z79899 Other long term (current) drug therapy: Secondary | ICD-10-CM | POA: Insufficient documentation

## 2016-02-14 DIAGNOSIS — B9789 Other viral agents as the cause of diseases classified elsewhere: Secondary | ICD-10-CM

## 2016-02-14 DIAGNOSIS — J069 Acute upper respiratory infection, unspecified: Secondary | ICD-10-CM | POA: Insufficient documentation

## 2016-02-14 DIAGNOSIS — J988 Other specified respiratory disorders: Secondary | ICD-10-CM

## 2016-02-14 DIAGNOSIS — R109 Unspecified abdominal pain: Secondary | ICD-10-CM | POA: Insufficient documentation

## 2016-02-14 DIAGNOSIS — F17210 Nicotine dependence, cigarettes, uncomplicated: Secondary | ICD-10-CM | POA: Insufficient documentation

## 2016-02-14 DIAGNOSIS — Z792 Long term (current) use of antibiotics: Secondary | ICD-10-CM | POA: Insufficient documentation

## 2016-02-14 MED ORDER — GUAIFENESIN-CODEINE 100-10 MG/5ML PO SOLN: 10.0000 mL | Freq: Three times a day (TID) | ORAL | Status: DC | PRN

## 2016-02-14 NOTE — ED Notes (Signed)
Pt states that he started not feeling well last night, states that he started with congestion and cough, states that his cough has been productive and green at times, pt has food in bed with him and is eating at this time, pt states that his taste is off, things don't smell and taste as usual and his desire to smoke is diminished, no distress noted

## 2016-02-14 NOTE — Discharge Instructions (Signed)
Viral Infections °A viral infection can be caused by different types of viruses. Most viral infections are not serious and resolve on their own. However, some infections may cause severe symptoms and may lead to further complications. °SYMPTOMS °Viruses can frequently cause: °· Minor sore throat. °· Aches and pains. °· Headaches. °· Runny nose. °· Different types of rashes. °· Watery eyes. °· Tiredness. °· Cough. °· Loss of appetite. °· Gastrointestinal infections, resulting in nausea, vomiting, and diarrhea. °These symptoms do not respond to antibiotics because the infection is not caused by bacteria. However, you might catch a bacterial infection following the viral infection. This is sometimes called a "superinfection." Symptoms of such a bacterial infection may include: °· Worsening sore throat with pus and difficulty swallowing. °· Swollen neck glands. °· Chills and a high or persistent fever. °· Severe headache. °· Tenderness over the sinuses. °· Persistent overall ill feeling (malaise), muscle aches, and tiredness (fatigue). °· Persistent cough. °· Yellow, green, or brown mucus production with coughing. °HOME CARE INSTRUCTIONS  °· Only take over-the-counter or prescription medicines for pain, discomfort, diarrhea, or fever as directed by your caregiver. °· Drink enough water and fluids to keep your urine clear or pale yellow. Sports drinks can provide valuable electrolytes, sugars, and hydration. °· Get plenty of rest and maintain proper nutrition. Soups and broths with crackers or rice are fine. °SEEK IMMEDIATE MEDICAL CARE IF:  °· You have severe headaches, shortness of breath, chest pain, neck pain, or an unusual rash. °· You have uncontrolled vomiting, diarrhea, or you are unable to keep down fluids. °· You or your child has an oral temperature above 102° F (38.9° C), not controlled by medicine. °· Your baby is older than 3 months with a rectal temperature of 102° F (38.9° C) or higher. °· Your baby is 3  months old or younger with a rectal temperature of 100.4° F (38° C) or higher. °MAKE SURE YOU:  °· Understand these instructions. °· Will watch your condition. °· Will get help right away if you are not doing well or get worse. °  °This information is not intended to replace advice given to you by your health care provider. Make sure you discuss any questions you have with your health care provider. °  °Document Released: 09/16/2005 Document Revised: 02/29/2012 Document Reviewed: 05/15/2015 °Elsevier Interactive Patient Education ©2016 [employer] Inc. ° °

## 2016-02-14 NOTE — ED Provider Notes (Signed)
[facility] Emergency Department Provider Note  ____________________________________________  Time seen: Approximately 2:35 PM  I have reviewed the triage vital signs and the nursing notes.   HISTORY  Chief Complaint Cough and Headache   HPI Darryl Flynn is a 35 y.o. male presents to the emergency department for evaluation of cough, congestion, and body aches. Symptoms started yesterday. He has not taken any type of medication over-the-counter. Nothing seems to make the symptoms better or worse.   Past Medical History  Diagnosis Date  . Back ache     There are no active problems to display for this patient.   History reviewed. No pertinent past surgical history.  Current Outpatient Rx  Name  Route  Sig  Dispense  Refill  . amoxicillin (AMOXIL) 875 MG tablet   Oral   Take 1 tablet (875 mg total) by mouth 2 (two) times daily.   14 tablet   0   . guaiFENesin-codeine 100-10 MG/5ML syrup   Oral   Take 10 mLs by mouth 3 (three) times daily as needed.   120 mL   0   . ibuprofen (ADVIL,MOTRIN) 800 MG tablet   Oral   Take 1 tablet (800 mg total) by mouth every 8 (eight) hours as needed.   15 tablet   0   . magic mouthwash w/lidocaine SOLN   Oral   Take 5 mLs by mouth 4 (four) times daily.   240 mL   0     Dispense in a 1/1/1/1 ratio. Use lidocaine, diphen ...   . penicillin v potassium (VEETID) 250 MG tablet   Oral   Take 1 tablet (250 mg total) by mouth 4 (four) times daily.   28 tablet   0   . traMADol (ULTRAM) 50 MG tablet   Oral   Take 1 tablet (50 mg total) by mouth every 6 (six) hours as needed.   20 tablet   0     Allergies Review of patient's allergies indicates no known allergies.  History reviewed. No pertinent family history.  Social History Social History  Substance Use Topics  . Smoking status: Current Every Day Smoker -- 0.50 packs/day    Types: Cigarettes  . Smokeless tobacco: None  . Alcohol Use: Yes     Comment: twice a week    Review of Systems Constitutional: No fever/chills Eyes: No visual changes. ENT: No sore throat. Cardiovascular: Denies chest pain. Respiratory: Negative for shortness of breath. Positive for cough. Gastrointestinal: Is for abdominal pain. No nausea,  no vomiting.  No diarrhea.  Genitourinary: Negative for dysuria. Musculoskeletal: Positive for body aches Skin: Negative for rash. Neurological: Positive for headaches, Negative for focal weakness or numbness.  10-point ROS otherwise negative.  ____________________________________________   PHYSICAL EXAM:  VITAL SIGNS: ED Triage Vitals  Enc Vitals Group     BP 02/14/16 1407 126/58 mmHg     Pulse Rate 02/14/16 1407 93     Resp 02/14/16 1407 18     Temp 02/14/16 1407 98.4 F (36.9 C)     Temp src --      SpO2 02/14/16 1407 96 %     Weight 02/14/16 1407 150 lb (68.04 kg)     Height 02/14/16 1407  (1.753 m)     Head Cir --      Peak Flow --      Pain Score 02/14/16 1408 8     Pain Loc --      Pain Edu? --  Excl. in GC? --     Constitutional: Alert and oriented. Well appearing and in no acute distress. Eyes: Conjunctivae are normal. PERRL. EOMI. Ears: Tympanic membranes are normal bilaterally Head: Atraumatic. Nose: Mucous membranes are moist, nasal mucosa is boggy and erythematous Mouth/Throat: Mucous membranes are moist.  Oropharynx mildly erythematous without tonsillar edema or exudate. Neck: No stridor.  Lymphatic: Positive for nontender anterior cervical lymphadenopathy. Cardiovascular: Normal rate, regular rhythm. Grossly normal heart sounds.  Good peripheral circulation. Respiratory: Normal respiratory effort.  No retractions. Lungs clear to auscultation. Musculoskeletal: Active range of motion observed in all extremities Neurologic:  Normal speech and language. No gross focal neurologic deficits are appreciated. Speech is normal. No gait instability. Skin:  Skin is warm, dry and  intact. No rash noted. Psychiatric: Mood and affect are normal. Speech and behavior are normal.  ____________________________________________   LABS (all labs ordered are listed, but only abnormal results are displayed)  Labs Reviewed - No data to display ____________________________________________  EKG   ____________________________________________  RADIOLOGY  Not indicated ____________________________________________   PROCEDURES  Procedure(s) performed: None  Critical Care performed: No  ____________________________________________   INITIAL IMPRESSION / ASSESSMENT AND PLAN / ED COURSE  Pertinent labs & imaging results that were available during my care of the patient were reviewed by me and considered in my medical decision making (see chart for details).   Patient was advised to take the Robitussin-AC as prescribed, rest, and increase his fluid intake. He was advised to follow-up with the primary care provider of his choice or return to the emergency department for symptoms that change or worsen. ____________________________________________   FINAL CLINICAL IMPRESSION(S) / ED DIAGNOSES  Final diagnoses:  Viral respiratory illness       Chinita Pester, FNP 02/14/16 1439  Gayla Doss, MD 02/14/16 1623

## 2016-02-14 NOTE — ED Notes (Signed)
Pt states that last night he started "coughing up green stuff" and is complaining of headache and body aches.

## 2016-05-13 ENCOUNTER — Emergency Department: Payer: Self-pay

## 2016-05-13 ENCOUNTER — Emergency Department
Admission: EM | Admit: 2016-05-13 | Discharge: 2016-05-13 | Disposition: A | Discharge status: Home / Self Care | Payer: Self-pay | Attending: Emergency Medicine | Admitting: Emergency Medicine

## 2016-05-13 ENCOUNTER — Encounter: Payer: Self-pay | Admitting: Emergency Medicine

## 2016-05-13 DIAGNOSIS — F17210 Nicotine dependence, cigarettes, uncomplicated: Secondary | ICD-10-CM | POA: Insufficient documentation

## 2016-05-13 DIAGNOSIS — M25542 Pain in joints of left hand: Secondary | ICD-10-CM

## 2016-05-13 DIAGNOSIS — F1290 Cannabis use, unspecified, uncomplicated: Secondary | ICD-10-CM | POA: Insufficient documentation

## 2016-05-13 DIAGNOSIS — M79642 Pain in left hand: Secondary | ICD-10-CM | POA: Insufficient documentation

## 2016-05-13 MED ORDER — NAPROXEN 500 MG PO TABS: 500.0000 mg | ORAL_TABLET | Freq: Two times a day (BID) | ORAL | Status: DC

## 2016-05-13 NOTE — Discharge Instructions (Signed)
Metacarpal Fracture  A metacarpal fracture is a break (fracture) of a bone in the hand. Metacarpals are the bones that extend from your knuckles to your wrist. In each hand, you have five metacarpal bones that connect your fingers and your thumb to your wrist.    Some hand fractures have bone pieces that are close together and stable (simple). These fractures may be treated with only a splint or cast. Hand fractures that have many pieces of broken bone (comminuted), unstable bone pieces (displaced), or a bone that breaks through the skin (compound) usually require surgery.  CAUSES  This injury may be caused by:  A fall.  A hard, direct hit to your hand.  An injury that squeezes your knuckle, stretches your finger out of place, or crushes your hand. RISK FACTORS  This injury is more likely to occur if:  You play contact sports.  You have certain bone diseases. SYMPTOMS  Symptoms of this type of fracture develop soon after the injury. Symptoms may include:  Swelling.  Pain.  Stiffness.  Increased pain with movement.  Bruising.  Inability to move a finger.  A shortened finger.  A finger knuckle that looks sunken in.  Unusual appearance of the hand or finger (deformity). DIAGNOSIS  This injury may be diagnosed based on your signs and symptoms, especially if you had a recent hand injury. Your health care provider will perform a physical exam. He or she may also order X-rays to confirm the diagnosis.  TREATMENT  Treatment for this injury depends on the type of fracture you have and how severe it is. Possible treatments include:  Non-reduction. This can be done if the bone does not need to be moved back into place. The fracture can be casted or splinted as it is.  Closed reduction. If your bone is stable and can be moved back into place, you may only need to wear a cast or splint or have buddy taping.  Closed reduction with internal fixation (CRIF). This is the most common treatment. You may  have this procedure if your bone can be moved back into place but needs more support. Wires, pins, or screws may be inserted through your skin to stabilize the fracture.  Open reduction with internal fixation (ORIF). This may be needed if your fracture is severe and unstable. It involves surgery to move your bone back into the right position. Screws, wires, or plates are used to stabilize the fracture. After all procedures, you may need to wear a cast or a splint for several weeks. You will also need to have follow-up X-rays to make sure that the bone is healing well and staying in position. After you no longer need your cast or splint, you may need physical therapy. This will help you to regain full movement and strength in your hand.  HOME CARE INSTRUCTIONS  If You Have a Cast:  Do not stick anything inside the cast to scratch your skin. Doing that increases your risk of infection.  Check the skin around the cast every day. Report any concerns to your health care provider. You may put lotion on dry skin around the edges of the cast. Do not apply lotion to the skin underneath the cast. If You Have a Splint:  Wear it as directed by your health care provider. Remove it only as directed by your health care provider.  Loosen the splint if your fingers become numb and tingle, or if they turn cold and blue. Bathing  Cover  the cast or splint with a watertight plastic bag to protect it from water while you take a bath or a shower. Do not let the cast or splint get wet. Managing Pain, Stiffness, and Swelling  If directed, apply ice to the injured area (if you have a splint, not a cast):  Put ice in a plastic bag.  Place a towel between your skin and the bag.  Leave the ice on for 20 minutes, 2-3 times a day. Move your fingers often to avoid stiffness and to lessen swelling.  Raise the injured area above the level of your heart while you are sitting or lying down. Driving  Do not drive or operate heavy  machinery while taking pain medicine.  Do not drive while wearing a cast or splint on a hand that you use for driving. Activity  Return to your normal activities as directed by your health care provider. Ask your health care provider what activities are safe for you. General Instructions  Do not put pressure on any part of the cast or splint until it is fully hardened. This may take several hours.  Keep the cast or splint clean and dry.  Do not use any tobacco products, including cigarettes, chewing tobacco, or electronic cigarettes. Tobacco can delay bone healing. If you need help quitting, ask your health care provider.  Take medicines only as directed by your health care provider.  Keep all follow-up visits as directed by your health care provider. This is important. SEEK MEDICAL CARE IF:  Your pain is getting worse.  You have redness, swelling, or pain in the injured area.  You have fluid, blood, or pus coming from under your cast or splint.  You notice a bad smell coming from under your cast or splint.  You have a fever.  SEEK IMMEDIATE MEDICAL CARE IF:  You develop a rash.  You have trouble breathing.  Your skin or nails on your injured hand turn blue or gray even after you loosen your splint.  Your injured hand feels cold or becomes numb even after you loosen your splint.  You develop severe pain under the cast or in your hand. This information is not intended to replace advice given to you by your health care provider. Make sure you discuss any questions you have with your health care provider.  Document Released: 12/07/2005 Document Revised: 08/28/2015 Document Reviewed: 09/26/2014  Elsevier Interactive Patient Education 2016 Elsevier Inc.    Arthritis  Arthritis means joint pain. It can also mean joint disease. A joint is a place where bones come together. People who have arthritis may have:  Red joints.  Swollen joints.  Stiff joints.  Warm joints.  A fever.    A  feeling of being sick. HOME CARE  Pay attention to any changes in your symptoms. Take these actions to help with your pain and swelling.  Medicines  Take over-the-counter and prescription medicines only as told by your doctor.  Do not take aspirin for pain if your doctor says that you may have gout. Activities  Rest your joint if your doctor tells you to.  Avoid activities that make the pain worse.  Exercise your joint regularly as told by your doctor. Try doing exercises like:  Swimming.  Water aerobics.  Biking.  Walking. Joint Care  If your joint is swollen, keep it raised (elevated) if told by your doctor.  If your joint feels stiff in the morning, try taking a warm shower.  If you  have diabetes, do not apply heat without asking your doctor.  If told, apply heat to the joint:  Put a towel between the joint and the hot pack or heating pad.  Leave the heat on the area for 20-30 minutes. If told, apply ice to the joint:  Put ice in a plastic bag.  Place a towel between your skin and the bag.  Leave the ice on for 20 minutes, 2-3 times per day. Keep all follow-up visits as told by your doctor. GET HELP IF:  The pain gets worse.  You have a fever. GET HELP RIGHT AWAY IF:  You have very bad pain in your joint.  You have swelling in your joint.  Your joint is red.  Many joints become painful and swollen.  You have very bad back pain.  Your leg is very weak.  You cannot control your pee (urine) or poop (stool). This information is not intended to replace advice given to you by your health care provider. Make sure you discuss any questions you have with your health care provider.  Document Released: 03/03/2010 Document Revised: 08/28/2015 Document Reviewed: 03/04/2015  Elsevier Interactive Patient Education [employer]2016 Elsevier Inc.

## 2016-05-13 NOTE — ED Provider Notes (Signed)
[facility] Emergency Department Provider Note  ____________________________________________  Time seen: Approximately 8:17 PM  I have reviewed the triage vital signs and the nursing notes.   HISTORY  Chief Complaint Hand Problem    HPI Darryl Flynn is a 35 y.o. male , NAD, presents to the emergency department with one-day history of swelling and mild pain about the left hand. States he injured the left hand last summer and believes it was fractured. He did not seek medical care at that time. States last night had some pain in the left hand and noted swelling while he was working. Denies any new injuries while at work. Has not had any numbness, weakness, tingling about the hand. No open wounds or lesions. Has not lost grip strength. Has not taken anything over-the-counter for pain.    Past Medical History  Diagnosis Date  . Back ache     There are no active problems to display for this patient.   History reviewed. No pertinent past surgical history.  Current Outpatient Rx  Name  Route  Sig  Dispense  Refill  . naproxen (NAPROSYN) 500 MG tablet   Oral   Take 1 tablet (500 mg total) by mouth 2 (two) times daily with a meal.   14 tablet   0     Allergies Review of patient's allergies indicates no known allergies.  No family history on file.  Social History Social History  Substance Use Topics  . Smoking status: Current Every Day Smoker -- 0.50 packs/day    Types: Cigarettes  . Smokeless tobacco: None  . Alcohol Use: Yes     Comment: twice a week     Review of Systems  Constitutional: No fever/chills Cardiovascular: No chest pain. Respiratory: No shortness of breath.  Musculoskeletal: Positive left hand pain. Skin: Positive redness and swelling about back of left hand. Negative for rash or skin sores, open wounds. Neurological: Negative for headaches, focal weakness or numbness. No tingling. No decrease in grip  strength.   ____________________________________________   PHYSICAL EXAM:  VITAL SIGNS: ED Triage Vitals  Enc Vitals Group     BP 05/13/16 1958 123/65 mmHg     Pulse Rate 05/13/16 1958 77     Resp 05/13/16 1958 20     Temp 05/13/16 1958 98 F (36.7 C)     Temp Source 05/13/16 1958 Oral     SpO2 05/13/16 1958 99 %     Weight 05/13/16 1958 148 lb (67.132 kg)     Height 05/13/16 1958 5\' 11"  (1.803 m)     Head Cir --      Peak Flow --      Pain Score 05/13/16 1958 8     Pain Loc --      Pain Edu? --      Excl. in GC? --      Constitutional: Alert and oriented. Well appearing and in no acute distress. Eyes: Conjunctivae are normal. Head: Atraumatic. Cardiovascular: Good peripheral circulationNoting 2+ pulses in the left upper extremity. Capillary refill is brisk in the left upper extremity. Respiratory: Normal respiratory effort without tachypnea or retractions.  Musculoskeletal: Mild tenderness to palpation about the back of the left hand at the third, fourth, fifth metacarpals. Bony deformity is palpated about the fourth metacarpal. No pain with palpation about the wrist. Full range of motion of the left wrist, hand, fingers.  No joint effusions. Neurologic:  Normal speech and language. No gross focal neurologic deficits are appreciated. Sensation  to light touch is intact about the left upper extremity. Grip strength about the bilateral upper extremities is equal. Skin:  Mild swelling noted about the back of the left hand at approximately the third and fourth metacarpals. No induration or fluctuance. No skin sores or open wounds. Skin is warm, dry and intact. No rash noted. Psychiatric: Mood and affect are normal. Speech and behavior are normal. Patient exhibits appropriate insight and judgement.   ____________________________________________   LABS  None ____________________________________________  EKG  None ____________________________________________  RADIOLOGY I  have personally viewed and evaluated these images (plain radiographs) as part of my medical decision making, as well as reviewing the written report by the radiologist.  Dg Hand Complete Left  05/13/2016  CLINICAL DATA:  Left hand injury last summer with chronic pain across metacarpals. EXAM: LEFT HAND - COMPLETE 3+ VIEW COMPARISON:  None. FINDINGS: There is no evidence of fracture or dislocation. There is no evidence of arthropathy or other focal bone abnormality. Soft tissues are unremarkable. IMPRESSION: Negative. Electronically Signed   By: Elberta Fortis M.D.   On: 05/13/2016 20:48     My impression:  Healed fracture to distal 5th metacarpal. ____________________________________________    PROCEDURES  Procedure(s) performed: None   Medications - No data to display   ____________________________________________   INITIAL IMPRESSION / ASSESSMENT AND PLAN / ED COURSE  Pertinent imaging results that were available during my care of the patient were reviewed by me and considered in my medical decision making (see chart for details).  Patient's diagnosis is consistent with arthralgia of left hand. Patient will be discharged home with prescriptions for naproxen to take as directed. Patient is advised that he may use an over-the-counter hand compression glove or brace as needed. Patient is to follow up with Dr. Rosita Kea in orthopedics if symptoms persist past this treatment course. Patient is given ED precautions to return to the ED for any worsening or new symptoms.    ____________________________________________  FINAL CLINICAL IMPRESSION(S) / ED DIAGNOSES  Final diagnoses:  Arthralgia of left hand      NEW MEDICATIONS STARTED DURING THIS VISIT:  New Prescriptions   NAPROXEN (NAPROSYN) 500 MG TABLET    Take 1 tablet (500 mg total) by mouth 2 (two) times daily with a meal.         Hope Pigeon, PA-C 05/13/16 2102  Minna Antis, MD 05/13/16 2306

## 2016-05-13 NOTE — ED Notes (Signed)
Discussed discharge instructions, prescriptions, and follow-up care with patient. No questions or concerns at this time. Pt stable at discharge.  

## 2016-05-13 NOTE — ED Notes (Signed)
Patient ambulatory to triage with steady gait, without difficulty or distress noted; pt st while working last night noticed swelling; none at present but st some pain; denies any injury

## 2016-06-01 ENCOUNTER — Emergency Department
Admission: EM | Admit: 2016-06-01 | Discharge: 2016-06-01 | Disposition: A | Discharge status: Home / Self Care | Payer: Self-pay | Attending: Emergency Medicine | Admitting: Emergency Medicine

## 2016-06-01 ENCOUNTER — Encounter: Payer: Self-pay | Admitting: Emergency Medicine

## 2016-06-01 DIAGNOSIS — K0889 Other specified disorders of teeth and supporting structures: Secondary | ICD-10-CM | POA: Insufficient documentation

## 2016-06-01 DIAGNOSIS — F17210 Nicotine dependence, cigarettes, uncomplicated: Secondary | ICD-10-CM | POA: Insufficient documentation

## 2016-06-01 DIAGNOSIS — K029 Dental caries, unspecified: Secondary | ICD-10-CM | POA: Insufficient documentation

## 2016-06-01 MED ORDER — IBUPROFEN 600 MG PO TABS: 600.0000 mg | ORAL_TABLET | Freq: Three times a day (TID) | ORAL | Status: DC | PRN

## 2016-06-01 MED ORDER — LIDOCAINE VISCOUS 2 % MT SOLN
15.0000 mL | Freq: Once | OROMUCOSAL | Status: AC
  Administered 2016-06-01: 15 mL via OROMUCOSAL
  Filled 2016-06-01: qty 15

## 2016-06-01 MED ORDER — OXYCODONE-ACETAMINOPHEN 5-325 MG PO TABS
1.0000 | ORAL_TABLET | Freq: Once | ORAL | Status: AC
  Administered 2016-06-01: 1 via ORAL
  Filled 2016-06-01: qty 1

## 2016-06-01 MED ORDER — AMOXICILLIN 500 MG PO CAPS: 500.0000 mg | ORAL_CAPSULE | Freq: Three times a day (TID) | ORAL | Status: DC

## 2016-06-01 MED ORDER — IBUPROFEN 600 MG PO TABS
600.0000 mg | ORAL_TABLET | Freq: Once | ORAL | Status: AC
  Administered 2016-06-01: 600 mg via ORAL
  Filled 2016-06-01: qty 1

## 2016-06-01 MED ORDER — TRAMADOL HCL 50 MG PO TABS: 50.0000 mg | ORAL_TABLET | Freq: Four times a day (QID) | ORAL | Status: DC | PRN

## 2016-06-01 NOTE — ED Notes (Signed)
Pt reports that he has had dental pain for a few weeks, but today he has swollen gums and glands and is having difficulty swallowing. Pt states "it feels like my throat is closing." Pt alert & oriented. NAD noted.

## 2016-06-01 NOTE — ED Provider Notes (Signed)
[facility] Emergency Department Provider Note   ____________________________________________  Time seen: Approximately 5:56 PM  I have reviewed the triage vital signs and the nursing notes.   HISTORY  Chief Complaint Dental Pain    HPI Darryl Flynn is a 35 y.o. male patient complaining of dental pain for the 3 weeks. Patient state he is noticed his gums are swollen and having some mild dysphagia. No palliative measures for this complaint. Patient rates his pain as a 10 over 10.   Past Medical History  Diagnosis Date  . Back ache     There are no active problems to display for this patient.   History reviewed. No pertinent past surgical history.  Current Outpatient Rx  Name  Route  Sig  Dispense  Refill  . naproxen (NAPROSYN) 500 MG tablet   Oral   Take 1 tablet (500 mg total) by mouth 2 (two) times daily with a meal.   14 tablet   0     Allergies Review of patient's allergies indicates no known allergies.  History reviewed. No pertinent family history.  Social History Social History  Substance Use Topics  . Smoking status: Current Every Day Smoker -- 0.50 packs/day    Types: Cigarettes  . Smokeless tobacco: None  . Alcohol Use: Yes     Comment: twice a week    Review of Systems Constitutional: No fever/chills Eyes: No visual changes. ENT: No sore throat.Dental pain Cardiovascular: Denies chest pain. Respiratory: Denies shortness of breath. Gastrointestinal: No abdominal pain.  No nausea, no vomiting.  No diarrhea.  No constipation. Genitourinary: Negative for dysuria. Musculoskeletal: Negative for back pain. Skin: Negative for rash. Neurological: Negative for headaches, focal weakness or numbness.   ____________________________________________   PHYSICAL EXAM:  VITAL SIGNS: ED Triage Vitals  Enc Vitals Group     BP --      Pulse --      Resp --      Temp --      Temp src --      SpO2 --      Weight --        Height --      Head Cir --      Peak Flow --      Pain Score --      Pain Loc --      Pain Edu? --      Excl. in GC? --     Constitutional: Alert and oriented. Well appearing and in no acute distress. Eyes: Conjunctivae are normal. PERRL. EOMI. Head: Atraumatic. Nose: No congestion/rhinnorhea. Mouth/Throat: Mucous membranes are moist.  Oropharynx erythematous.Edematous gingiva with deep vitalized bilateral molars. Tonsils are edematous but not exudative. Neck: No stridor.  No cervical spine tenderness to palpation. Hematological/Lymphatic/Immunilogical: No cervical lymphadenopathy. Cardiovascular: Normal rate, regular rhythm. Grossly normal heart sounds.  Good peripheral circulation. Respiratory: Normal respiratory effort.  No retractions. Lungs CTAB. Gastrointestinal: Soft and nontender. No distention. No abdominal bruits. No CVA tenderness. Genitourinary:  Musculoskeletal: No lower extremity tenderness nor edema.  No joint effusions. Neurologic:  Normal speech and language. No gross focal neurologic deficits are appreciated. No gait instability. Skin:  Skin is warm, dry and intact. No rash noted. Psychiatric: Mood and affect are normal. Speech and behavior are normal.  ____________________________________________   LABS (all labs ordered are listed, but only abnormal results are displayed)  Labs Reviewed - No data to display ____________________________________________  EKG   ____________________________________________  RADIOLOGY   ____________________________________________  PROCEDURES  Procedure(s) performed: None  Critical Care performed: No  ____________________________________________   INITIAL IMPRESSION / ASSESSMENT AND PLAN / ED COURSE  Pertinent labs & imaging results that were available during my care of the patient were reviewed by me and considered in my medical decision making (see chart for details).  Dental caries and gingivitis.  Patient given discharge Instructions. Patient advised to follow-up list of dental clinics provided. Patient started on amoxicillin, tramadol,  and ibuprofen. ____________________________________________   FINAL CLINICAL IMPRESSION(S) / ED DIAGNOSES  Final diagnoses:  Pain due to dental caries      NEW MEDICATIONS STARTED DURING THIS VISIT:  New Prescriptions   No medications on file     Note:  This document was prepared using Dragon voice recognition software and may include unintentional dictation errors.    Joni Reining, PA-C 06/01/16 1806  Sharyn Creamer, MD 06/02/16 [phone]

## 2016-06-01 NOTE — Discharge Instructions (Signed)
Follow-up for list of dental clinics provided. °OPTIONS FOR DENTAL FOLLOW UP CARE ° °[place] [facility] and [facility] - Local Safety Net Dental Clinics °http://www.ncdhhs.gov/dph/oralhealth/services/safetynetclinics.htm °  °[facility] (336-562-3123) ° °[facility] [place] (919-933-9087) ° °[facility] ([phone] ext [phone]) ° °[facility] ([phone]) ° °[facility] ([phone]) °This clinic caters to the indigent population and is on a lottery system. °Location: °[facility], [address], [place] °Clinic Hours: °Wednesdays from 6pm - 9pm, patients seen by a lottery system. °For dates, call or go to [email or web address]/[facility]/[email or web address] °Services: °Cleanings, fillings and simple extractions. °Payment Options: °DENTAL WORK IS FREE OF CHARGE. Bring proof of income or support. °Best way to get seen: °Arrive at 5:15 pm - this is a lottery, NOT first come/first serve, so arriving earlier will not increase your chances of being seen. °  °  °[facility] °[phone] °Select option 1 for emergencies °  °Location: °[facility], [address], [place] °Clinic Hours: °No walk-ins accepted - call the day before to schedule an appointment. °Check in times are 9:30 am and 1:30 pm. °Services: °Simple extractions, temporary fillings, pulpectomy/pulp debridement, uncomplicated abscess drainage. °Payment Options: °PAYMENT IS DUE AT THE TIME OF SERVICE.  Fee is usually $100-200, additional surgical procedures (e.g. abscess drainage) may be extra. °Cash, checks, Visa/MasterCard accepted.  Can file Medicaid if patient is covered for dental - patient should call case worker to check. °No discount for [facility] patients. °Best way to get seen: °MUST call the day before and get onto the schedule. Can usually be seen the next 1-2 days. No walk-ins accepted. °  °   °[facility] °[phone] °  °Location: °[facility], [address], [place] °Clinic Hours: °M, W, Th, F 8am or 1:30pm, Tues 9a or 1:30 - first come/first served. °Services: °Simple extractions, temporary fillings, uncomplicated abscess drainage.  You do not need to be an [place] resident. °Payment Options: °PAYMENT IS DUE AT THE TIME OF SERVICE. °Dental insurance, otherwise sliding scale - bring proof of income or support. °Depending on income and treatment needed, cost is usually $50-200. °Best way to get seen: °Arrive early as it is first come/first served. °  °  °[facility] °[phone] °  °Location: °[address] °Clinic Hours: °Mon-Thu 8a-5p °Services: °Most basic dental services including extractions and fillings. °Payment Options: °PAYMENT IS DUE AT THE TIME OF SERVICE. °Sliding scale, up to 50% off - bring proof if income or support. °Medicaid with dental option accepted. °Best way to get seen: °Call to schedule an appointment, can usually be seen within 2 weeks OR they will try to see walk-ins - show up at 8a or 2p (you may have to wait). °  °  °[facility] °[phone] °[place] RESIDENTS ONLY °  °Location: °[facility], [address], [place], [place] [address] °Clinic Hours: By appointment only. °Monday - Thursday 8am-5pm, Friday 8am-12pm °Services: Cleanings, fillings, extractions. °Payment Options: °PAYMENT IS DUE AT THE TIME OF SERVICE. °Cash, Visa or MasterCard. Sliding scale - $30 minimum per service. °Best way to get seen: °Come in to office, complete packet and make an appointment - need proof of income °or support monies for each household member and proof of [place] residence. °Usually takes about a month to get in. °  °  °[facility] °  [phone] °  °Location: °[address] [place] °Clinic Hours: Walk-in Urgent Care  Dental Services are offered Monday-Friday mornings only. °The numbers of emergencies accepted daily is limited to the number of °providers available. °Maximum 15 - Mondays, Wednesdays & Thursdays °Maximum 10 - Tuesdays & Fridays °Services: °You do not need to be a [place] resident to be seen for a dental emergency. °Emergencies are defined as pain, swelling, abnormal bleeding, or dental trauma. Walkins will receive x-rays if needed. °NOTE: Dental cleaning is not an emergency. °Payment Options: °PAYMENT IS DUE AT THE TIME OF SERVICE. °Minimum co-pay is $40.00 for uninsured patients. °Minimum co-pay is $3.00 for Medicaid with dental coverage. °Dental Insurance is accepted and must be presented at time of visit. °Medicare does not cover dental. °Forms of payment: Cash, credit card, checks. °Best way to get seen: °If not previously registered with the clinic, walk-in dental registration begins at 7:15 am and is on a first come/first serve basis. °If previously registered with the clinic, call to make an appointment. °  °  °[facility] °[phone] °[place] RESIDENTS ONLY °  °Location: °[address], [place], [place] °Clinic Hours: °Mon-Thu 10a-2p °Services: Extractions only! °Payment Options: °FREE (donations accepted) - bring proof of income or support °Best way to get seen: °Call and schedule an appointment OR come at 8am on the 1st Monday of every month (except for holidays) when it is first come/first served. °  °  °[facility] °[phone] °  °Location: °[address], [place] °Clinic Hours: °Friday mornings °Services, Payment Options, Best way to get seen: °Call for info ° °

## 2016-06-01 NOTE — ED Notes (Signed)
Pt discharged home after verbalizing understanding of discharge instructions; nad noted. 

## 2016-07-20 ENCOUNTER — Emergency Department
Admission: EM | Admit: 2016-07-20 | Discharge: 2016-07-20 | Disposition: A | Discharge status: Home / Self Care | Payer: Self-pay | Attending: Emergency Medicine | Admitting: Emergency Medicine

## 2016-07-20 ENCOUNTER — Emergency Department: Payer: Self-pay

## 2016-07-20 ENCOUNTER — Encounter: Payer: Self-pay | Admitting: Emergency Medicine

## 2016-07-20 DIAGNOSIS — M25571 Pain in right ankle and joints of right foot: Secondary | ICD-10-CM | POA: Insufficient documentation

## 2016-07-20 DIAGNOSIS — Y929 Unspecified place or not applicable: Secondary | ICD-10-CM | POA: Insufficient documentation

## 2016-07-20 DIAGNOSIS — F17210 Nicotine dependence, cigarettes, uncomplicated: Secondary | ICD-10-CM | POA: Insufficient documentation

## 2016-07-20 DIAGNOSIS — X58XXXA Exposure to other specified factors, initial encounter: Secondary | ICD-10-CM | POA: Insufficient documentation

## 2016-07-20 DIAGNOSIS — Y999 Unspecified external cause status: Secondary | ICD-10-CM | POA: Insufficient documentation

## 2016-07-20 DIAGNOSIS — Y9367 Activity, basketball: Secondary | ICD-10-CM | POA: Insufficient documentation

## 2016-07-20 MED ORDER — HYDROCODONE-ACETAMINOPHEN 5-325 MG PO TABS: 1.0000 | ORAL_TABLET | ORAL | 0 refills | Status: DC | PRN

## 2016-07-20 MED ORDER — IBUPROFEN 800 MG PO TABS: 800.0000 mg | ORAL_TABLET | Freq: Three times a day (TID) | ORAL | 0 refills | Status: DC | PRN

## 2016-07-20 NOTE — ED Triage Notes (Signed)
Pt presents to ED with reports of right ankle pain due to injury during basketball game yesterday. Pt limping in triage.

## 2016-07-20 NOTE — Discharge Instructions (Signed)
Rest ice and elevation for the next several days.

## 2016-07-20 NOTE — ED Provider Notes (Signed)
[facility] Emergency Department Provider Note  ____________________________________________  Time seen: Approximately 11:45 AM  I have reviewed the triage vital signs and the nursing notes.   HISTORY  Chief Complaint Ankle Injury    HPI Darryl Flynn is a 35 y.o. male presents for evaluation of right ankle pain due to injury playing basketball last night. Patient complains of increased pain and difficulty with bearing weight.   Past Medical History:  Diagnosis Date  . Back ache     There are no active problems to display for this patient.   History reviewed. No pertinent surgical history.  Prior to Admission medications   Medication Sig Start Date End Date Taking? Authorizing Provider  HYDROcodone-acetaminophen (NORCO) 5-325 MG tablet Take 1-2 tablets by mouth every 4 (four) hours as needed for moderate pain. 07/20/16   Charmayne Sheer Beers, PA-C  ibuprofen (ADVIL,MOTRIN) 800 MG tablet Take 1 tablet (800 mg total) by mouth every 8 (eight) hours as needed. 07/20/16   Evangeline Dakin, PA-C    Allergies Review of patient's allergies indicates no known allergies.  No family history on file.  Social History Social History  Substance Use Topics  . Smoking status: Current Every Day Smoker    Packs/day: 0.50    Types: Cigarettes  . Smokeless tobacco: Not on file  . Alcohol use Yes     Comment: twice a week    Review of Systems Constitutional: No fever/chills Cardiovascular: Denies chest pain. Respiratory: Denies shortness of breath. Musculoskeletal: Positive for right ankle pain. Skin: Negative for rash. Neurological: Negative for headaches, focal weakness or numbness.  10-point ROS otherwise negative.  ____________________________________________   PHYSICAL EXAM:  VITAL SIGNS: ED Triage Vitals  Enc Vitals Group     BP 07/20/16 1129 105/87     Pulse Rate 07/20/16 1129 81     Resp 07/20/16 1129 18     Temp 07/20/16 1129 98.7 F (37.1  C)     Temp Source 07/20/16 1129 Oral     SpO2 07/20/16 1129 100 %     Weight 07/20/16 1129 150 lb (68 kg)     Height 07/20/16 1129 5\' 9"  (1.753 m)     Head Circumference --      Peak Flow --      Pain Score 07/20/16 1130 9     Pain Loc --      Pain Edu? --      Excl. in GC? --     Constitutional: Alert and oriented. Well appearing and in no acute distress. Musculoskeletal: Positive right ankle pain with limited range of motion increased pain with extension and flexion. Distally neurovascularly intact. Obvious edema noted medially. No ecchymosis or bruising noted. Neurologic:  Normal speech and language. No gross focal neurologic deficits are appreciated. No gait instability. Skin:  Skin is warm, dry and intact. No rash noted. Psychiatric: Mood and affect are normal. Speech and behavior are normal.  ____________________________________________   LABS (all labs ordered are listed, but only abnormal results are displayed)  Labs Reviewed - No data to display ____________________________________________  EKG   ____________________________________________  RADIOLOGY  No acute osseous findings. ____________________________________________   PROCEDURES  Procedure(s) performed: None  Critical Care performed: No  ____________________________________________   INITIAL IMPRESSION / ASSESSMENT AND PLAN / ED COURSE  Pertinent labs & imaging results that were available during my care of the patient were reviewed by me and considered in my medical decision making (see chart for details).  Acute right  ankle sprain. Focal ankle wrap and crutches provided for comfort. Patient was given a prescription for Vicodin and ibuprofen 800. Patient follow-up with PCP or orthopedics on-call needed.  Clinical Course    ____________________________________________   FINAL CLINICAL IMPRESSION(S) / ED DIAGNOSES  Final diagnoses:  Right ankle pain     This chart was dictated using  voice recognition software/Dragon. Despite best efforts to proofread, errors can occur which can change the meaning. Any change was purely unintentional.    Evangeline Dakin, PA-C 07/20/16 1624    Sharyn Creamer, MD 07/20/16 1630

## 2016-11-07 ENCOUNTER — Encounter: Payer: Self-pay | Admitting: Emergency Medicine

## 2016-11-07 DIAGNOSIS — F17210 Nicotine dependence, cigarettes, uncomplicated: Secondary | ICD-10-CM | POA: Insufficient documentation

## 2016-11-07 DIAGNOSIS — K029 Dental caries, unspecified: Secondary | ICD-10-CM | POA: Insufficient documentation

## 2016-11-07 DIAGNOSIS — K0381 Cracked tooth: Secondary | ICD-10-CM | POA: Insufficient documentation

## 2016-11-07 NOTE — ED Triage Notes (Signed)
Pt states has bilateral lower jaw pain since yesterday due to broken molars. Pt appears in no acute distress and is drinking po fluids during triage without difficulty.

## 2016-11-08 ENCOUNTER — Emergency Department
Admission: EM | Admit: 2016-11-08 | Discharge: 2016-11-08 | Disposition: A | Discharge status: Home / Self Care | Payer: Self-pay | Attending: Emergency Medicine | Admitting: Emergency Medicine

## 2016-11-08 DIAGNOSIS — K029 Dental caries, unspecified: Secondary | ICD-10-CM

## 2016-11-08 DIAGNOSIS — S025XXA Fracture of tooth (traumatic), initial encounter for closed fracture: Secondary | ICD-10-CM

## 2016-11-08 HISTORY — DX: Dorsalgia, unspecified: M54.9

## 2016-11-08 MED ORDER — CLINDAMYCIN HCL 150 MG PO CAPS
300.0000 mg | ORAL_CAPSULE | Freq: Once | ORAL | Status: AC
  Administered 2016-11-08: 300 mg via ORAL
  Filled 2016-11-08: qty 2

## 2016-11-08 MED ORDER — CLINDAMYCIN HCL 300 MG PO CAPS: 300.0000 mg | ORAL_CAPSULE | Freq: Three times a day (TID) | ORAL | 0 refills | Status: DC

## 2016-11-08 MED ORDER — IBUPROFEN 600 MG PO TABS: 600.0000 mg | ORAL_TABLET | Freq: Four times a day (QID) | ORAL | 0 refills | Status: DC | PRN

## 2016-11-08 MED ORDER — IBUPROFEN 800 MG PO TABS
800.0000 mg | ORAL_TABLET | ORAL | Status: AC
  Administered 2016-11-08: 800 mg via ORAL
  Filled 2016-11-08: qty 1

## 2016-11-08 NOTE — Discharge Instructions (Signed)
You have been seen in the Emergency Department (ED) today for dental pain.  You should also take over-the-counter pain medication such as ibuprofen according to the label instructions unless a doctor has previously told you to avoid this type of medication (due to stomach ulcers, for example). ° °Please see you dentist as soon as possible; only a dentist will be able to fix your problem(s).  Please see below for dental follow up options. ° °Return to the ED if you develop worsening pain, fever, pus/drainage, difficulty breathing, or other symptoms that concern you. ° °OPTIONS FOR DENTAL FOLLOW UP CARE ° °[place] Department of Health and Human Services - Local Safety Net Dental Clinics °http://www.ncdhhs.gov/dph/oralhealth/services/safetynetclinics.htm °  °[facility] ([phone]) ° °[facility] ([phone]) ° °[facility] ([phone] ext [phone]) ° °[facility] ([phone]) ° °[facility] ([phone]) °This clinic caters to the indigent population and is on a lottery system. °Location: °[facility], [address], [place] °Clinic Hours: °Wednesdays from 6pm - 9pm, patients seen by a lottery system. °For dates, call or go to [email or web address] °Services: °Cleanings, fillings and simple extractions. °Payment Options: °DENTAL WORK IS FREE OF CHARGE. Bring proof of income or support. °Best way to get seen: °Arrive at 5:15 pm - this is a lottery, NOT first come/first serve, so arriving earlier will not increase your chances of being seen. °  °  °[facility] °[phone] °Select option 1 for emergencies °  °Location: °[facility], [address], [place] °Clinic Hours: °No walk-ins accepted - call the day before to schedule an appointment. °Check in times are 9:30 am and 1:30 pm. °Services: °Simple extractions, temporary fillings, pulpectomy/pulp  debridement, uncomplicated abscess drainage. °Payment Options: °PAYMENT IS DUE AT THE TIME OF SERVICE.  Fee is usually $100-200, additional surgical procedures (e.g. abscess drainage) may be extra. °Cash, checks, Visa/MasterCard accepted.  Can file Medicaid if patient is covered for dental - patient should call case worker to check. °No discount for [facility] patients. °Best way to get seen: °MUST call the day before and get onto the schedule. Can usually be seen the next 1-2 days. No walk-ins accepted. °  °  °[facility] °[phone] °  °Location: °[facility], [address], [place] °Clinic Hours: °M, W, Th, F 8am or 1:30pm, Tues 9a or 1:30 - first come/first served. °Services: °Simple extractions, temporary fillings, uncomplicated abscess drainage.  You do not need to be an [place] resident. °Payment Options: °PAYMENT IS DUE AT THE TIME OF SERVICE. °Dental insurance, otherwise sliding scale - bring proof of income or support. °Depending on income and treatment needed, cost is usually $50-200. °Best way to get seen: °Arrive early as it is first come/first served. °  °  °[facility] °[phone] °  °Location: °[address] °Clinic Hours: °Mon-Thu 8a-5p °Services: °Most basic dental services including extractions and fillings. °Payment Options: °PAYMENT IS DUE AT THE TIME OF SERVICE. °Sliding scale, up to 50% off - bring proof if income or support. °Medicaid with dental option accepted. °Best way to get seen: °Call to schedule an appointment, can usually be seen within 2 weeks OR they will try to see walk-ins - show up at 8a or 2p (you may have to wait). °  °  °[facility] °[phone] °[place] RESIDENTS ONLY °  °Location: °[facility], [address], [place], [place] [address] Clinic Hours: By appointment only. Monday - Thursday 8am-5pm, Friday 8am-12pm Services: Cleanings,  fillings, extractions. Payment Options: PAYMENT IS DUE AT THE TIME OF SERVICE. Cash, Visa or MasterCard. Sliding scale - $30 minimum per service. Best way to get seen: Come in to office, complete packet and make an appointment - need proof of income or support monies for each household member and proof of [facility] residence. Usually takes about a month to get in.     [facility] [phone]   Location: [address] [facility] Clinic Hours: Walk-in Urgent Care Dental Services are offered Monday-Friday mornings only. The numbers of emergencies accepted daily is limited to the number of providers available. Maximum 15 - Mondays, Wednesdays & Thursdays Maximum 10 - Tuesdays & Fridays Services: You do not need to be a [facility] resident to be seen for a dental emergency. Emergencies are defined as pain, swelling, abnormal bleeding, or dental trauma. Walkins will receive x-rays if needed. NOTE: Dental cleaning is not an emergency. Payment Options: PAYMENT IS DUE AT THE TIME OF SERVICE. Minimum co-pay is $40.00 for uninsured patients. Minimum co-pay is $3.00 for Medicaid with dental coverage. Dental Insurance is accepted and must be presented at time of visit. Medicare does not cover dental. Forms of payment: Cash, credit card, checks. Best way to get seen: If not previously registered with the clinic, walk-in dental registration begins at 7:15 am and is on a first come/first serve basis. If previously registered with the clinic, call to make an appointment.     [facility] [phone] [place] RESIDENTS ONLY   Location: [address] [place], [place] Clinic Hours: Mon-Thu 10a-2p Services: Extractions only! Payment Options: FREE (donations accepted) - bring proof of income or support Best way to get seen: Call and schedule an appointment OR come at 8am on the 1st Monday of every month (except for holidays) when it is  first come/first served.     [facility] [phone]   Location: [address] [place], [place] Clinic Hours: Friday mornings Services, Payment Options, Best way to get seen: Call for info

## 2016-11-08 NOTE — ED Provider Notes (Signed)
[facility] Emergency Department Provider Note   ____________________________________________   First MD Initiated Contact with Patient 11/08/16 [phone]     (approximate)  I have reviewed the triage vital signs and the nursing notes.   HISTORY  Chief Complaint Dental Pain    HPI Darryl Flynn is a 35 y.o. male here for evaluation of pain in the lower molars on both sides of the mouth.  Patient reports he was eating something yesterday when the left lower molar fractured and he spit it out. Said having pain and discomfort in the left lower jaw since. Denies facial swelling fevers chills or trouble opening or closing the mouth but reports a constant moderate pain in the left lower jaw.  No trauma  Past Medical History:  Diagnosis Date  . Back ache   . Back pain     There are no active problems to display for this patient.   History reviewed. No pertinent surgical history.  Prior to Admission medications   Medication Sig Start Date End Date Taking? Authorizing Provider  clindamycin (CLEOCIN) 300 MG capsule Take 1 capsule (300 mg total) by mouth 3 (three) times daily. 11/08/16   Sharyn CreamerMark Jeramie Scogin, MD  ibuprofen (ADVIL,MOTRIN) 600 MG tablet Take 1 tablet (600 mg total) by mouth every 6 (six) hours as needed. 11/08/16   Sharyn CreamerMark Dresean Beckel, MD    Allergies Patient has no known allergies.  History reviewed. No pertinent family history.  Social History Social History  Substance Use Topics  . Smoking status: Current Every Day Smoker    Packs/day: 0.50    Types: Cigarettes  . Smokeless tobacco: Never Used  . Alcohol use Yes     Comment: twice a week    Review of Systems Constitutional: No fever/chills Eyes: No visual changes. ENT: No sore throat. See history of present illness. Denies trouble swallowing. Cardiovascular: Denies chest pain. Respiratory: Denies shortness of breath. Gastrointestinal: No abdominal pain.  No nausea, no vomiting.  No diarrhea.   No constipation. Skin: Negative for rash. Neurological: Negative for headaches, focal weakness or numbness.  10-point ROS otherwise negative.  ____________________________________________   PHYSICAL EXAM:  VITAL SIGNS: ED Triage Vitals  Enc Vitals Group     BP 11/07/16 2358 113/78     Pulse Rate 11/07/16 2358 79     Resp 11/07/16 2358 18     Temp 11/07/16 2358 98.4 F (36.9 C)     Temp Source 11/07/16 2358 Oral     SpO2 11/07/16 2358 98 %     Weight 11/07/16 2341 145 lb (65.8 kg)     Height 11/07/16 2341 5\' 9"  (1.753 m)     Head Circumference --      Peak Flow --      Pain Score 11/07/16 2341 8     Pain Loc --      Pain Edu? --      Excl. in GC? --     Constitutional: Alert and oriented. Well appearing and in no acute distress. Eyes: Conjunctivae are normal. PERRL. EOMI. Head: Atraumatic. Nose: No congestion/rhinnorhea. Mouth/Throat: Mucous membranes are moist.  Oropharynx non-erythematous.No trismus. No tenderness or anterior jaw swelling. No facial swelling or edema. No anterior neck tenderness. Somewhat poor dentition throughout, though the patient has a notable fairly large cavity in the right lower molar, on the left lower jaw wisdom tooth is slightly visible posteriorly, with the molar anterior missing with a small cavity noted, consistent with a dental fracture no mucosal edema  or evidence of abscess Neck: No stridor.   Cardiovascular: Normal rate, regular rhythm. Grossly normal heart sounds.  Good peripheral circulation. Respiratory: Normal respiratory effort.   Gastrointestinal: Soft and nontender. No distention. No abdominal bruits. No CVA tenderness. Neurologic:  Normal speech and language. No gross focal neurologic deficits are appreciated. No gait instability. Skin:  Skin is warm, dry and intact. No rash noted. Psychiatric: Mood and affect are normal. Speech and behavior are normal.  ____________________________________________   LABS (all labs ordered are  listed, but only abnormal results are displayed)  Labs Reviewed - No data to display ____________________________________________  EKG   ____________________________________________  RADIOLOGY   ____________________________________________   PROCEDURES  Procedure(s) performed: None  Procedures  Critical Care performed: No  ____________________________________________   INITIAL IMPRESSION / ASSESSMENT AND PLAN / ED COURSE  Pertinent labs & imaging results that were available during my care of the patient were reviewed by me and considered in my medical decision making (see chart for details).  Dental caries and a dental fracture. No evidence of obvious superinfection or deep tissue infection.  Return precautions and treatment recommendations and follow-up discussed with the patient who is agreeable with the plan.   Clinical Course      ____________________________________________   FINAL CLINICAL IMPRESSION(S) / ED DIAGNOSES  Final diagnoses:  Dental caries  Closed fracture of tooth, initial encounter      NEW MEDICATIONS STARTED DURING THIS VISIT:  New Prescriptions   CLINDAMYCIN (CLEOCIN) 300 MG CAPSULE    Take 1 capsule (300 mg total) by mouth 3 (three) times daily.   IBUPROFEN (ADVIL,MOTRIN) 600 MG TABLET    Take 1 tablet (600 mg total) by mouth every 6 (six) hours as needed.     Note:  This document was prepared using Dragon voice recognition software and may include unintentional dictation errors.     Sharyn CreamerMark Monterrio Gerst, MD 11/08/16 [phone]

## 2016-11-08 NOTE — ED Notes (Signed)
Pt asleep upon this nurse entering room for discharge

## 2017-05-20 ENCOUNTER — Encounter: Payer: Self-pay | Admitting: Emergency Medicine

## 2017-05-20 ENCOUNTER — Emergency Department
Admission: EM | Admit: 2017-05-20 | Discharge: 2017-05-20 | Disposition: A | Discharge status: Home / Self Care | Payer: Self-pay | Attending: Emergency Medicine | Admitting: Emergency Medicine

## 2017-05-20 DIAGNOSIS — F17210 Nicotine dependence, cigarettes, uncomplicated: Secondary | ICD-10-CM | POA: Insufficient documentation

## 2017-05-20 DIAGNOSIS — K0889 Other specified disorders of teeth and supporting structures: Secondary | ICD-10-CM | POA: Insufficient documentation

## 2017-05-20 DIAGNOSIS — K0381 Cracked tooth: Secondary | ICD-10-CM | POA: Insufficient documentation

## 2017-05-20 MED ORDER — AMOXICILLIN 500 MG PO CAPS
500.0000 mg | ORAL_CAPSULE | Freq: Three times a day (TID) | ORAL | Status: DC
  Administered 2017-05-20: 500 mg via ORAL
  Filled 2017-05-20: qty 1

## 2017-05-20 MED ORDER — AMOXICILLIN 500 MG PO CAPS: 500.0000 mg | ORAL_CAPSULE | Freq: Three times a day (TID) | ORAL | 0 refills | Status: AC

## 2017-05-20 MED ORDER — NAPROXEN 500 MG PO TBEC: 500.0000 mg | DELAYED_RELEASE_TABLET | Freq: Two times a day (BID) | ORAL | 0 refills | Status: AC

## 2017-05-20 NOTE — Discharge Instructions (Signed)
OPTIONS FOR DENTAL FOLLOW UP CARE ° °[place] Department of Health and Human Services - Local Safety Net Dental Clinics °http://www.ncdhhs.gov/dph/oralhealth/services/safetynetclinics.htm °  °[facility] ([phone]) ° °[facility] ([phone]) ° °[facility] ([phone] ext [phone]) ° °[facility] ([phone]) ° °[facility] ([phone]) °This clinic caters to the indigent population and is on a lottery system. °Location: °[facility], [address], [place] °Clinic Hours: °Wednesdays from 6pm - 9pm, patients seen by a lottery system. °For dates, call or go to [email or web address] °Services: °Cleanings, fillings and simple extractions. °Payment Options: °DENTAL WORK IS FREE OF CHARGE. Bring proof of income or support. °Best way to get seen: °Arrive at 5:15 pm - this is a lottery, NOT first come/first serve, so arriving earlier will not increase your chances of being seen. °  °  °[facility] °[phone] °Select option 1 for emergencies °  °Location: °[facility], [address], [place] °Clinic Hours: °No walk-ins accepted - call the day before to schedule an appointment. °Check in times are 9:30 am and 1:30 pm. °Services: °Simple extractions, temporary fillings, pulpectomy/pulp debridement, uncomplicated abscess drainage. °Payment Options: °PAYMENT IS DUE AT THE TIME OF SERVICE.  Fee is usually $100-200, additional surgical procedures (e.g. abscess drainage) may be extra. °Cash, checks, Visa/MasterCard accepted.  Can file Medicaid if patient is covered for dental - patient should call case worker to check. °No discount for [facility] patients. °Best way to get seen: °MUST call the day before and get onto the schedule. Can usually be seen the next 1-2 days. No walk-ins accepted. °  °  °[facility] °[phone] °   °Location: °[facility], [address], [place] °Clinic Hours: °M, W, Th, F 8am or 1:30pm, Tues 9a or 1:30 - first come/first served. °Services: °Simple extractions, temporary fillings, uncomplicated abscess drainage.  You do not need to be an [place] resident. °Payment Options: °PAYMENT IS DUE AT THE TIME OF SERVICE. °Dental insurance, otherwise sliding scale - bring proof of income or support. °Depending on income and treatment needed, cost is usually $50-200. °Best way to get seen: °Arrive early as it is first come/first served. °  °  °[facility] °[phone] °  °Location: °[address] °Clinic Hours: °Mon-Thu 8a-5p °Services: °Most basic dental services including extractions and fillings. °Payment Options: °PAYMENT IS DUE AT THE TIME OF SERVICE. °Sliding scale, up to 50% off - bring proof if income or support. °Medicaid with dental option accepted. °Best way to get seen: °Call to schedule an appointment, can usually be seen within 2 weeks OR they will try to see walk-ins - show up at 8a or 2p (you may have to wait). °  °  °[facility] °[phone] °[place] RESIDENTS ONLY °  °Location: °[facility], [address], [place], [place] [address] °Clinic Hours: By appointment only. °Monday - Thursday 8am-5pm, Friday 8am-12pm °Services: Cleanings, fillings, extractions. °Payment Options: °PAYMENT IS DUE AT THE TIME OF SERVICE. °Cash, Visa or MasterCard. Sliding scale - $30 minimum per service. °Best way to get seen: °Come in to office, complete packet and make an appointment - need proof of income °or support monies for each household member and proof of [place] residence. °Usually takes about a month to get in. °  °  °[facility] °[phone] °  °Location: °[address]   [place] °Clinic Hours: Walk-in Urgent Care Dental Services are offered Monday-Friday  mornings only. °The numbers of emergencies accepted daily is limited to the number of °providers available. °Maximum 15 - Mondays, Wednesdays & Thursdays °Maximum 10 - Tuesdays & Fridays °Services: °You do not need to be a [place] resident to be seen for a dental emergency. °Emergencies are defined as pain, swelling, abnormal bleeding, or dental trauma. Walkins will receive x-rays if needed. °NOTE: Dental cleaning is not an emergency. °Payment Options: °PAYMENT IS DUE AT THE TIME OF SERVICE. °Minimum co-pay is $40.00 for uninsured patients. °Minimum co-pay is $3.00 for Medicaid with dental coverage. °Dental Insurance is accepted and must be presented at time of visit. °Medicare does not cover dental. °Forms of payment: Cash, credit card, checks. °Best way to get seen: °If not previously registered with the clinic, walk-in dental registration begins at 7:15 am and is on a first come/first serve basis. °If previously registered with the clinic, call to make an appointment. °  °  °[facility] °[phone] °[place] RESIDENTS ONLY °  °Location: °[address], [place], [place] °Clinic Hours: °Mon-Thu 10a-2p °Services: Extractions only! °Payment Options: °FREE (donations accepted) - bring proof of income or support °Best way to get seen: °Call and schedule an appointment OR come at 8am on the 1st Monday of every month (except for holidays) when it is first come/first served. °  °  °[facility] °[phone] °  °Location: °[address], [place] °Clinic Hours: °Friday mornings °Services, Payment Options, Best way to get seen: °Call for info °

## 2017-05-20 NOTE — ED Triage Notes (Signed)
Pt comes into the ED via POv c/o dental pain on bilateral lower sides.  Patient in NAD at this time. States the pain radiates into his gums.

## 2017-05-20 NOTE — ED Notes (Signed)
Pt reports that he has broken teeth on the right and lower left side of his mouth - he states that his gums are red and swollen - pain present since this am

## 2017-05-20 NOTE — ED Provider Notes (Signed)
[facility] Emergency Department Provider Note  ____________________________________________  Time seen: Approximately 10:11 PM  I have reviewed the triage vital signs and the nursing notes.   HISTORY  Chief Complaint Dental Pain    HPI Darryl Flynn is a 36 y.o. male presenting with 10/10 bilateral lower jaw pain from broken inferior 30 and 19. No fever or chills. Patient does not currently have an appointment with a local dentist. Patient states that it hurts to chew on both sides. No alleviating measures have been attempted.   Past Medical History:  Diagnosis Date  . Back ache   . Back pain     There are no active problems to display for this patient.   History reviewed. No pertinent surgical history.  Prior to Admission medications   Medication Sig Start Date End Date Taking? Authorizing Provider  amoxicillin (AMOXIL) 500 MG capsule Take 1 capsule (500 mg total) by mouth 3 (three) times daily. 05/20/17 05/30/17  Orvil FeilWoods, Althea Backs M, PA-C  clindamycin (CLEOCIN) 300 MG capsule Take 1 capsule (300 mg total) by mouth 3 (three) times daily. 11/08/16   Sharyn CreamerQuale, Mark, MD  ibuprofen (ADVIL,MOTRIN) 600 MG tablet Take 1 tablet (600 mg total) by mouth every 6 (six) hours as needed. 11/08/16   Sharyn CreamerQuale, Mark, MD  naproxen (EC NAPROSYN) 500 MG EC tablet Take 1 tablet (500 mg total) by mouth 2 (two) times daily with a meal. 05/20/17 05/30/17  Orvil FeilWoods, Noboru Bidinger M, PA-C    Allergies Patient has no known allergies.  No family history on file.  Social History Social History  Substance Use Topics  . Smoking status: Current Every Day Smoker    Packs/day: 0.50    Types: Cigarettes  . Smokeless tobacco: Never Used  . Alcohol use Yes     Comment: twice a week     Review of Systems  Constitutional: No fever/chills Eyes: No visual changes. No discharge ENT: Patient has bilateral lower jaw pain Cardiovascular: no chest pain. Respiratory: no cough. No  SOB. Musculoskeletal: Negative for musculoskeletal pain. Skin: Negative for rash, abrasions, lacerations, ecchymosis. Neurological: Negative for headaches, focal weakness or numbness.   ____________________________________________   PHYSICAL EXAM:  VITAL SIGNS: ED Triage Vitals  Enc Vitals Group     BP 05/20/17 2105 111/73     Pulse Rate 05/20/17 2105 68     Resp 05/20/17 2105 16     Temp 05/20/17 2105 98.7 F (37.1 C)     Temp Source 05/20/17 2105 Oral     SpO2 05/20/17 2105 98 %     Weight 05/20/17 2104 150 lb (68 kg)     Height 05/20/17 2104 5\' 9"  (1.753 m)     Head Circumference --      Peak Flow --      Pain Score 05/20/17 2103 10     Pain Loc --      Pain Edu? --      Excl. in GC? --      Constitutional: Alert and oriented. Well appearing and in no acute distress. Eyes: Conjunctivae are normal. PERRL. EOMI. Head: Atraumatic.      Mouth/Throat: Mucous membranes are moist. Patient has broken inferior 30 and 19. Neck: Full range of motion. Cardiovascular: Normal rate, regular rhythm. Normal S1 and S2.  Good peripheral circulation. Respiratory: Normal respiratory effort without tachypnea or retractions. Lungs CTAB. Good air entry to the bases with no decreased or absent breath sounds. Skin: No palpable edema of the jaw. Psychiatric: Mood and  affect are normal. Speech and behavior are normal. Patient exhibits appropriate insight and judgement.   ____________________________________________   LABS (all labs ordered are listed, but only abnormal results are displayed)  Labs Reviewed - No data to display ____________________________________________  EKG   ____________________________________________  RADIOLOGY   No results found.  ____________________________________________    PROCEDURES  Procedure(s) performed:    Procedures    Medications  amoxicillin (AMOXIL) capsule 500 mg (not administered)      ____________________________________________   INITIAL IMPRESSION / ASSESSMENT AND PLAN / ED COURSE  Pertinent labs & imaging results that were available during my care of the patient were reviewed by me and considered in my medical decision making (see chart for details).  Review of the [place] CSRS was performed in accordance of the [facility] prior to dispensing any controlled drugs.    Assessment and plan: Dental pain Patient presents to the emergency department with pain from broken inferior 30 and inferior 19. Patient was discharged with amoxicillin and naproxen. Vital signs were reassuring prior to discharge. All patient questions were answered.    ____________________________________________  FINAL CLINICAL IMPRESSION(S) / ED DIAGNOSES  Final diagnoses:  Pain, dental      NEW MEDICATIONS STARTED DURING THIS VISIT:  New Prescriptions   AMOXICILLIN (AMOXIL) 500 MG CAPSULE    Take 1 capsule (500 mg total) by mouth 3 (three) times daily.   NAPROXEN (EC NAPROSYN) 500 MG EC TABLET    Take 1 tablet (500 mg total) by mouth 2 (two) times daily with a meal.        This chart was dictated using voice recognition software/Dragon. Despite best efforts to proofread, errors can occur which can change the meaning. Any change was purely unintentional.    Gasper Lloyd 05/20/17 2217    Loleta Rose, MD 05/20/17 [phone]

## 2020-04-24 ENCOUNTER — Ambulatory Visit: Payer: Self-pay

## 2020-04-25 ENCOUNTER — Encounter: Payer: Self-pay | Admitting: Physician Assistant

## 2020-04-25 ENCOUNTER — Other Ambulatory Visit: Payer: Self-pay

## 2020-04-25 ENCOUNTER — Ambulatory Visit: Payer: Self-pay | Admitting: Physician Assistant

## 2020-04-25 DIAGNOSIS — I4891 Unspecified atrial fibrillation: Secondary | ICD-10-CM | POA: Insufficient documentation

## 2020-04-25 DIAGNOSIS — K219 Gastro-esophageal reflux disease without esophagitis: Secondary | ICD-10-CM

## 2020-04-25 DIAGNOSIS — K449 Diaphragmatic hernia without obstruction or gangrene: Secondary | ICD-10-CM | POA: Insufficient documentation

## 2020-04-25 DIAGNOSIS — Z202 Contact with and (suspected) exposure to infections with a predominantly sexual mode of transmission: Secondary | ICD-10-CM

## 2020-04-25 DIAGNOSIS — Z113 Encounter for screening for infections with a predominantly sexual mode of transmission: Secondary | ICD-10-CM

## 2020-04-25 MED ORDER — METRONIDAZOLE 500 MG PO TABS: 500.0000 mg | ORAL_TABLET | Freq: Once | ORAL | 0 refills | Status: AC

## 2020-04-25 NOTE — Progress Notes (Signed)
   [facility] Department STI clinic/screening visit  Subjective:  Darryl Flynn is a 39 y.o. male being seen today for an STI screening visit. The patient reports they do not have symptoms.    Patient has the following medical conditions:  There are no problems to display for this patient.    Chief Complaint  Patient presents with  . SEXUALLY TRANSMITTED DISEASE    screening    HPI  Patient reports that he is not having any symptoms but is a contact to Trich and would like treatment.  Declines any screening tests, exam and blood work today.  Reports h/o Atrial fibrillation, hiatal hernia and "severe reflux".  States that he is out of his reflux medicine and needs to pick up a refill but is taking his hear medicine.   See flowsheet for further details and programmatic requirements.    The following portions of the patient's history were reviewed and updated as appropriate: allergies, current medications, past medical history, past social history, past surgical history and problem list.  Objective:  There were no vitals filed for this visit.  Physical Exam Constitutional:      General: He is not in acute distress.    Appearance: Normal appearance.  HENT:     Head: Normocephalic and atraumatic.  Eyes:     Conjunctiva/sclera: Conjunctivae normal.  Pulmonary:     Effort: Pulmonary effort is normal.  Neurological:     Mental Status: He is alert and oriented to person, place, and time.  Psychiatric:        Mood and Affect: Mood normal.        Behavior: Behavior normal.        Thought Content: Thought content normal.        Judgment: Judgment normal.       Assessment and Plan:  Delfino Dripps is a 39 y.o. male presenting to the [facility] Department for STI screening  1. Screening for STD (sexually transmitted disease) Patient into clinic without symptoms.  Declines screening exam, testing and blood work today. Rec condoms with all sex. RTC  prn.  2. Venereal disease contact Will treat as a contact to Trich with Metronidazole 2 g po with food, no EtOH for 24 hr before and until 72 hr after completing meidcine. No sex for 7 days and until after partner completes treatment. RTC for re-treatment if vomits < 2 hr after taking medicine. - metroNIDAZOLE (FLAGYL) 500 MG tablet; Take 1 tablet (500 mg total) by mouth once for 1 dose.  Dispense: 4 tablet; Refill: 0     No follow-ups on file.  No future appointments.  Matt Holmes, PA

## 2020-04-27 NOTE — Progress Notes (Signed)
Chart reviewed by Pharmacist  Suzanne Walker PharmD, Contract Pharmacist at [facility]  

## 2020-05-15 ENCOUNTER — Encounter: Payer: Self-pay | Admitting: Emergency Medicine

## 2020-05-15 ENCOUNTER — Other Ambulatory Visit: Payer: Self-pay

## 2020-05-15 ENCOUNTER — Emergency Department: Payer: Self-pay

## 2020-05-15 ENCOUNTER — Emergency Department
Admission: EM | Admit: 2020-05-15 | Discharge: 2020-05-15 | Disposition: A | Discharge status: Home / Self Care | Payer: Self-pay | Attending: Emergency Medicine | Admitting: Emergency Medicine

## 2020-05-15 DIAGNOSIS — F17210 Nicotine dependence, cigarettes, uncomplicated: Secondary | ICD-10-CM | POA: Insufficient documentation

## 2020-05-15 DIAGNOSIS — R0981 Nasal congestion: Secondary | ICD-10-CM | POA: Insufficient documentation

## 2020-05-15 DIAGNOSIS — J40 Bronchitis, not specified as acute or chronic: Secondary | ICD-10-CM | POA: Insufficient documentation

## 2020-05-15 DIAGNOSIS — R059 Cough, unspecified: Secondary | ICD-10-CM

## 2020-05-15 DIAGNOSIS — Z79899 Other long term (current) drug therapy: Secondary | ICD-10-CM | POA: Insufficient documentation

## 2020-05-15 DIAGNOSIS — Z20822 Contact with and (suspected) exposure to covid-19: Secondary | ICD-10-CM | POA: Insufficient documentation

## 2020-05-15 HISTORY — DX: Unspecified atrial fibrillation: I48.91

## 2020-05-15 LAB — CBC
HCT: 42.1 % (ref 39.0–52.0)
Hemoglobin: 14.5 g/dL (ref 13.0–17.0)
MCH: 31.5 pg (ref 26.0–34.0)
MCHC: 34.4 g/dL (ref 30.0–36.0)
MCV: 91.3 fL (ref 80.0–100.0)
Platelets: 281 10*3/uL (ref 150–400)
RBC: 4.61 MIL/uL (ref 4.22–5.81)
RDW: 12.6 % (ref 11.5–15.5)
WBC: 5 10*3/uL (ref 4.0–10.5)
nRBC: 0 % (ref 0.0–0.2)

## 2020-05-15 LAB — BASIC METABOLIC PANEL
Anion gap: 7 (ref 5–15)
BUN: 11 mg/dL (ref 6–20)
CO2: 31 mmol/L (ref 22–32)
Calcium: 9.5 mg/dL (ref 8.9–10.3)
Chloride: 102 mmol/L (ref 98–111)
Creatinine, Ser: 1.09 mg/dL (ref 0.61–1.24)
GFR calc Af Amer: >60 mL/min (ref >60)
GFR calc non Af Amer: >60 mL/min (ref >60)
Glucose, Bld: 94 mg/dL (ref 70–99)
Potassium: 3.7 mmol/L (ref 3.5–5.1)
Sodium: 140 mmol/L (ref 135–145)

## 2020-05-15 LAB — TROPONIN I (HIGH SENSITIVITY): Troponin I (High Sensitivity): 3 ng/L (ref <18)

## 2020-05-15 LAB — SARS CORONAVIRUS 2 BY RT PCR (HOSPITAL ORDER, PERFORMED IN ~~LOC~~ HOSPITAL LAB): SARS Coronavirus 2: NEGATIVE

## 2020-05-15 MED ORDER — PREDNISONE 20 MG PO TABS: 40.0000 mg | ORAL_TABLET | Freq: Every day | ORAL | 0 refills | Status: AC

## 2020-05-15 MED ORDER — AMOXICILLIN-POT CLAVULANATE 875-125 MG PO TABS
1.0000 | ORAL_TABLET | Freq: Two times a day (BID) | ORAL | 0 refills | Status: AC
Start: 2020-05-15 — End: 2020-05-29

## 2020-05-15 MED ORDER — ALBUTEROL SULFATE HFA 108 (90 BASE) MCG/ACT IN AERS
2.0000 | INHALATION_SPRAY | Freq: Four times a day (QID) | RESPIRATORY_TRACT | 0 refills | Status: DC | PRN
Start: 2020-05-15 — End: 2021-07-11

## 2020-05-15 NOTE — ED Triage Notes (Signed)
Pt reports sinus congestions, nasal drainage and just dont feel well since last week

## 2020-05-15 NOTE — ED Triage Notes (Signed)
Pt also reports it taking metoprolol and he feels like hs a-fib is acting up

## 2020-05-15 NOTE — ED Notes (Signed)
Says sicke with cough, congestion in head and chest with green nasal drainage.   No fever.  Says he has a history of atrial fib and on metoprolol, but says he has had some heart racing since he has been sick.  I put him on the monitor and NSR currently.  Has deep sounding cough and sounds nasally congested.

## 2020-05-15 NOTE — Discharge Instructions (Addendum)
Take the steroids, antibiotics to help with your symptoms.  You can take Tylenol 1 g every 8 hours.  You take Flonase over the counter.  There are no signs of heart arrhythmia or heart problems at this time you follow with your cardiologist if he continues to have any issues.  You should return to ER if develop worsening symptoms or any other concerns also testing for Covid and you should be quarantined today until results come

## 2020-05-15 NOTE — ED Provider Notes (Signed)
[facility] Emergency Department Provider Note  ____________________________________________   First MD Initiated Contact with Patient 05/15/20 1312     (approximate)  I have reviewed the triage vital signs and the nursing notes.   HISTORY  Chief Complaint Nasal Congestion, Facial Pain, and Cough    HPI Kaz Norgaard is a 39 y.o. male with A. fib who comes in with nasal congestion, facial pain and cough.  Patient states that he has had symptoms for over a week now.  Patient reports having a lot of nasal congestion and a lot of yellowish discharge and pain in his sinuses.  He is also having a cough.  His symptoms are moderate, daily, nothing makes better, nothing makes them worse.  Denies any overt shortness of breath or chest pain.  He has not had Covid before not had vaccinations.  He states that he said his feel some palpitations and just wanted to make sure his heart rate looked good as well.  He is on metoprolol for A. fib          Past Medical History:  Diagnosis Date  . A-fib (HCC)   . Back ache   . Back pain     Patient Active Problem List   Diagnosis Date Noted  . Atrial fibrillation (HCC) 04/25/2020  . Hiatal hernia with GERD 04/25/2020    No past surgical history on file.  Prior to Admission medications   Medication Sig Start Date End Date Taking? Authorizing Provider  clindamycin (CLEOCIN) 300 MG capsule Take 1 capsule (300 mg total) by mouth 3 (three) times daily. 11/08/16   Sharyn Creamer, MD  ibuprofen (ADVIL,MOTRIN) 600 MG tablet Take 1 tablet (600 mg total) by mouth every 6 (six) hours as needed. 11/08/16   Sharyn Creamer, MD    Allergies Citrus  No family history on file.  Social History Social History   Tobacco Use  . Smoking status: Current Every Day Smoker    Packs/day: 0.50    Types: Cigarettes  . Smokeless tobacco: Never Used  Substance Use Topics  . Alcohol use: Yes    Comment: twice a week  . Drug use: Yes   Types: Marijuana      Review of Systems Constitutional: No fever/chills Eyes: No visual changes. ENT: No sore throat.  Cough, congestion Cardiovascular: Denies chest pain.  Concern for possible irregular heart rate Respiratory: Denies shortness of breath. Gastrointestinal: No abdominal pain.  No nausea, no vomiting.  No diarrhea.  No constipation. Genitourinary: Negative for dysuria. Musculoskeletal: Negative for back pain. Skin: Negative for rash. Neurological: Negative for headaches, focal weakness or numbness. All other ROS negative ____________________________________________   PHYSICAL EXAM:  VITAL SIGNS: ED Triage Vitals  Enc Vitals Group     BP 05/15/20 1145 120/78     Pulse Rate 05/15/20 1145 73     Resp 05/15/20 1145 20     Temp 05/15/20 1145 98 F (36.7 C)     Temp Source 05/15/20 1145 Oral     SpO2 05/15/20 1145 100 %     Weight 05/15/20 1023 157 lb (71.2 kg)     Height 05/15/20 1023 5\' 9"  (1.753 m)     Head Circumference --      Peak Flow --      Pain Score 05/15/20 1022 5     Pain Loc --      Pain Edu? --      Excl. in GC? --     Constitutional:  Alert and oriented. Well appearing and in no acute distress. Eyes: Conjunctivae are normal. EOMI. Head: Atraumatic. Nose: No congestion/rhinnorhea. Mouth/Throat: Mucous membranes are moist.   Neck: No stridor. Trachea Midline. FROM Cardiovascular: Normal rate, regular rhythm. Grossly normal heart sounds.  Good peripheral circulation. Respiratory: Normal respiratory effort.  No retractions. Lungs CTAB. Gastrointestinal: Soft and nontender. No distention. No abdominal bruits.  Musculoskeletal: No lower extremity tenderness nor edema.  No joint effusions. Neurologic:  Normal speech and language. No gross focal neurologic deficits are appreciated.  Skin:  Skin is warm, dry and intact. No rash noted. Psychiatric: Mood and affect are normal. Speech and behavior are normal. GU: Deferred    ____________________________________________   LABS (all labs ordered are listed, but only abnormal results are displayed)  Labs Reviewed  SARS CORONAVIRUS 2 BY RT PCR (HOSPITAL ORDER, PERFORMED IN [facility])  BASIC METABOLIC PANEL  CBC  TROPONIN I (HIGH SENSITIVITY)  TROPONIN I (HIGH SENSITIVITY)   ____________________________________________   ED ECG REPORT I, Concha Se, the attending physician, personally viewed and interpreted this ECG.  Normal sinus rate 66, no ST elevation, normal T waves, type I AV block ____________________________________________  RADIOLOGY Vela Prose, personally viewed and evaluated these images (plain radiographs) as part of my medical decision making, as well as reviewing the written report by the radiologist.  ED MD interpretation: Chronic COPD changes  Official radiology report(s): DG Chest 2 View  Result Date: 05/15/2020 CLINICAL DATA:  Chest pain. Smoker. EXAM: CHEST - 2 VIEW COMPARISON:  06/27/2012 FINDINGS: Normal sized heart. The lungs remain mildly hyperexpanded mild central peribronchial thickening. Normal vascularity. No pleural fluid. Mild scoliosis. IMPRESSION: No acute abnormality. Stable mild changes of COPD and chronic bronchitis. Electronically Signed   By: Beckie Salts M.D.   On: 05/15/2020 10:40    ____________________________________________   PROCEDURES  Procedure(s) performed (including Critical Care):  Procedures   ____________________________________________   INITIAL IMPRESSION / ASSESSMENT AND PLAN / ED COURSE  Albion Sorlie was evaluated in Emergency Department on 05/15/2020 for the symptoms described in the history of present illness. He was evaluated in the context of the global COVID-19 pandemic, which necessitated consideration that the patient might be at risk for infection with the SARS-CoV-2 virus that causes COVID-19. Institutional protocols and algorithms that pertain to the  evaluation of patients at risk for COVID-19 are in a state of rapid change based on information released by regulatory bodies including the CDC and federal and state organizations. These policies and algorithms were followed during the patient's care in the ED.    Patient is a very well-appearing 39 year old with stable vital signs who comes in with cough, congestion, sinus pressure is been going on for over a week.  Given the prolonged course and his history of COPD will cover him for a possible bronchitis given his cough and some Augmentin to cover for sinusitis.  Also given albuterol inhaler in case he develops any shortness of breath noted below do not hear any wheezing at this time.  Patient does still smoke though.  Patient understand he should cut down his smoking.  Patient has no evidence of EKG changes to suggest A. fib at this time.  He has negative cardiac markers and I am low suspicion for ACS at this time.  Chest x-ray no evidence of pneumonia.  Stable COPD changes. Cardiac markers are negative. Labs are reassuring  I discussed the provisional nature of ED diagnosis, the treatment so far, the  ongoing plan of care, follow up appointments and return precautions with the patient and any family or support people present. They expressed understanding and agreed with the plan, discharged home.   ____________________________________________   FINAL CLINICAL IMPRESSION(S) / ED DIAGNOSES   Final diagnoses:  Bronchitis  Cough  Nasal congestion      MEDICATIONS GIVEN DURING THIS VISIT:  Medications - No data to display   ED Discharge Orders         Ordered    amoxicillin-clavulanate (AUGMENTIN) 875-125 MG tablet  2 times daily     05/15/20 1325    predniSONE (DELTASONE) 20 MG tablet  Daily     05/15/20 1325    albuterol (VENTOLIN HFA) 108 (90 Base) MCG/ACT inhaler  Every 6 hours PRN     05/15/20 1325           Note:  This document was prepared using Dragon voice  recognition software and may include unintentional dictation errors.   Vanessa Kent, MD 05/15/20 1329

## 2020-05-15 NOTE — ED Triage Notes (Signed)
Pt called for triage, no response. 

## 2020-05-24 ENCOUNTER — Other Ambulatory Visit: Payer: Self-pay

## 2020-05-24 ENCOUNTER — Emergency Department
Admission: EM | Admit: 2020-05-24 | Discharge: 2020-05-24 | Disposition: A | Discharge status: Home / Self Care | Payer: Self-pay | Attending: Emergency Medicine | Admitting: Emergency Medicine

## 2020-05-24 ENCOUNTER — Emergency Department: Payer: Self-pay

## 2020-05-24 DIAGNOSIS — M67442 Ganglion, left hand: Secondary | ICD-10-CM | POA: Insufficient documentation

## 2020-05-24 DIAGNOSIS — K449 Diaphragmatic hernia without obstruction or gangrene: Secondary | ICD-10-CM | POA: Insufficient documentation

## 2020-05-24 DIAGNOSIS — F17210 Nicotine dependence, cigarettes, uncomplicated: Secondary | ICD-10-CM | POA: Insufficient documentation

## 2020-05-24 LAB — CBC
HCT: 40.8 % (ref 39.0–52.0)
Hemoglobin: 13.6 g/dL (ref 13.0–17.0)
MCH: 31.3 pg (ref 26.0–34.0)
MCHC: 33.3 g/dL (ref 30.0–36.0)
MCV: 93.8 fL (ref 80.0–100.0)
Platelets: 282 10*3/uL (ref 150–400)
RBC: 4.35 MIL/uL (ref 4.22–5.81)
RDW: 13 % (ref 11.5–15.5)
WBC: 6.2 10*3/uL (ref 4.0–10.5)
nRBC: 0 % (ref 0.0–0.2)

## 2020-05-24 LAB — BASIC METABOLIC PANEL
Anion gap: 7 (ref 5–15)
BUN: 21 mg/dL — ABNORMAL HIGH (ref 6–20)
CO2: 28 mmol/L (ref 22–32)
Calcium: 9.1 mg/dL (ref 8.9–10.3)
Chloride: 106 mmol/L (ref 98–111)
Creatinine, Ser: 1.05 mg/dL (ref 0.61–1.24)
GFR calc Af Amer: >60 mL/min (ref >60)
GFR calc non Af Amer: >60 mL/min (ref >60)
Glucose, Bld: 120 mg/dL — ABNORMAL HIGH (ref 70–99)
Potassium: 4.1 mmol/L (ref 3.5–5.1)
Sodium: 141 mmol/L (ref 135–145)

## 2020-05-24 MED ORDER — HYDROCODONE-ACETAMINOPHEN 5-325 MG PO TABS: 1.0000 | ORAL_TABLET | Freq: Four times a day (QID) | ORAL | 0 refills | Status: DC | PRN

## 2020-05-24 MED ORDER — SUCRALFATE 1 GM/10ML PO SUSP
1.0000 g | Freq: Four times a day (QID) | ORAL | 1 refills | Status: DC
Start: 2020-05-24 — End: 2021-07-11

## 2020-05-24 MED ORDER — SODIUM CHLORIDE 0.9% FLUSH: 3.0000 mL | Freq: Once | INTRAVENOUS | Status: DC

## 2020-05-24 MED ORDER — FAMOTIDINE 20 MG PO TABS
20.0000 mg | ORAL_TABLET | Freq: Two times a day (BID) | ORAL | 0 refills | Status: DC
Start: 2020-05-24 — End: 2024-01-23

## 2020-05-24 NOTE — ED Notes (Signed)
No answer when called several times from lobby and on cell phone °

## 2020-05-24 NOTE — ED Triage Notes (Signed)
Pt arrives to ED via POV from home with c/o left hand pain since Tuesday. No known injury or trauma. CMS intact. Pt also reports a h/x of a hiatal hernia and requesting to be evaluated for same. Pt denies any c/o CP or SHOB. Pt is A&O, in NAD; RR even, regular, and unlabored.

## 2020-05-24 NOTE — ED Provider Notes (Signed)
[facility] Emergency Department Provider Note   ____________________________________________   First MD Initiated Contact with Patient 05/24/20 [phone]     (approximate)  I have reviewed the triage vital signs and the nursing notes.   HISTORY  Chief Complaint Hand Pain    HPI Darryl Flynn is a 39 y.o. male who presents to the ED from home with a chief complaint of nontraumatic left hand pain and evaluation of hiatal hernia/GERD symptoms.  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  Began to experience a painful knot on top of his left hand 3 days ago.  Denies extremity weakness, numbness or tingling.  Also reports he has had a hiatal hernia for the past year or so; was not prescribed anything for it once he got out of prison last year.  Reports chronic burning in epigastrium.  Denies fever, chills, cough, chest pain, shortness of breath, abdominal pain, nausea, vomiting, diarrhea.       Past Medical History:  Diagnosis Date  . A-fib ([place])   . Back ache   . Back pain     Patient Active Problem List   Diagnosis Date Noted  . Atrial fibrillation ([place]) 04/25/2020  . Hiatal hernia with GERD 04/25/2020    History reviewed. No pertinent surgical history.  Prior to Admission medications   Medication Sig Start Date End Date Taking? Authorizing Provider  albuterol (VENTOLIN HFA) 108 (90 Base) MCG/ACT inhaler Inhale 2 puffs into the lungs every 6 (six) hours as needed for up to 7 days for wheezing or shortness of breath. 05/15/20 05/22/20  Vanessa Cedro, MD  amoxicillin-clavulanate (AUGMENTIN) 875-125 MG tablet Take 1 tablet by mouth 2 (two) times daily for 14 days. 05/15/20 05/29/20  Vanessa Kingsford, MD  clindamycin (CLEOCIN) 300 MG capsule Take 1 capsule (300 mg total) by mouth 3 (three) times daily. 11/08/16   Delman Kitten, MD  famotidine (PEPCID) 20 MG tablet Take 1 tablet (20 mg total) by mouth 2 (two) times daily. 05/24/20    Paulette Blanch, MD  HYDROcodone-acetaminophen (NORCO) 5-325 MG tablet Take 1 tablet by mouth every 6 (six) hours as needed for moderate pain. 05/24/20   Paulette Blanch, MD  ibuprofen (ADVIL,MOTRIN) 600 MG tablet Take 1 tablet (600 mg total) by mouth every 6 (six) hours as needed. 11/08/16   Delman Kitten, MD  sucralfate (CARAFATE) 1 GM/10ML suspension Take 10 mLs (1 g total) by mouth 4 (four) times daily. 05/24/20   Paulette Blanch, MD    Allergies Citrus  No family history on file.  Social History Social History   Tobacco Use  . Smoking status: Current Every Day Smoker    Packs/day: 0.50    Types: Cigarettes  . Smokeless tobacco: Never Used  Substance Use Topics  . Alcohol use: Yes    Comment: twice a week  . Drug use: Yes    Types: Marijuana    Review of Systems  Constitutional: No fever/chills Eyes: No visual changes. ENT: No sore throat. Cardiovascular: Denies chest pain. Respiratory: Denies shortness of breath. Gastrointestinal: Positive for chronic abdominal burning.  No abdominal pain.  No nausea, no vomiting.  No diarrhea.  No constipation. Genitourinary: Negative for dysuria. Musculoskeletal: Positive for left hand pain.  Negative for back pain. Skin: Negative for rash. Neurological: Negative for headaches, focal weakness or numbness.   ____________________________________________   PHYSICAL EXAM:  VITAL SIGNS: ED Triage Vitals [05/24/20 0026]  Enc Vitals Group  BP (!) 126/91     Pulse Rate 77     Resp 17     Temp 98.7 F (37.1 C)     Temp Source Oral     SpO2 98 %     Weight 150 lb (68 kg)     Height 5\' 9"  (1.753 m)     Head Circumference      Peak Flow      Pain Score 8     Pain Loc      Pain Edu?      Excl. in GC?     Constitutional: Alert and oriented. Well appearing and in no acute distress. Eyes: Conjunctivae are normal. PERRL. EOMI. Head: Atraumatic. Nose: No congestion/rhinnorhea. Mouth/Throat: Mucous membranes are moist.  Oropharynx  non-erythematous. Neck: No stridor.   Cardiovascular: Normal rate, regular rhythm. Grossly normal heart sounds.  Good peripheral circulation. Respiratory: Normal respiratory effort.  No retractions. Lungs CTAB. Gastrointestinal: Soft and nontender to light or deep palpation. No distention. No abdominal bruits. No CVA tenderness. Musculoskeletal:  Left hand: Ganglion cyst noted to dorsum of hand which is tender to palpation.  Full range of motion wrist without pain.  2+ radial pulses.  Brisk, less than 5-second capillary refill. No lower extremity tenderness nor edema.  No joint effusions. Neurologic:  Normal speech and language. No gross focal neurologic deficits are appreciated. No gait instability. Skin:  Skin is warm, dry and intact. No rash noted. Psychiatric: Mood and affect are normal. Speech and behavior are normal.  ____________________________________________   LABS (all labs ordered are listed, but only abnormal results are displayed)  Labs Reviewed  BASIC METABOLIC PANEL - Abnormal; Notable for the following components:      Result Value   Glucose, Bld 120 (*)    BUN 21 (*)    All other components within normal limits  CBC   ____________________________________________  EKG  None ____________________________________________  RADIOLOGY  ED MD interpretation: No acute abnormality of left hand  Official radiology report(s): DG Hand Complete Left  Result Date: 05/24/2020 CLINICAL DATA:  Hand pain for several days, no known injury, initial encounter EXAM: LEFT HAND - COMPLETE 3+ VIEW COMPARISON:  05/13/2016 FINDINGS: Old healed fifth metacarpal fracture is noted. No acute fracture is seen. No soft tissue abnormality is noted. IMPRESSION: No acute abnormality noted. Electronically Signed   By: 05/15/2016 M.D.   On: 05/24/2020 00:56    ____________________________________________   PROCEDURES  Procedure(s) performed (including Critical  Care):  Procedures   ____________________________________________   INITIAL IMPRESSION / ASSESSMENT AND PLAN / ED COURSE  As part of my medical decision making, I reviewed the following data within the electronic MEDICAL RECORD NUMBER Nursing notes reviewed and incorporated, Labs reviewed, Radiograph reviewed, Notes from prior ED visits and [place] Controlled Substance Database     Demetre Karel was evaluated in Emergency Department on 05/24/2020 for the symptoms described in the history of present illness. He was evaluated in the context of the global COVID-19 pandemic, which necessitated consideration that the patient might be at risk for infection with the SARS-CoV-2 virus that causes COVID-19. Institutional protocols and algorithms that pertain to the evaluation of patients at risk for COVID-19 are in a state of rapid change based on information released by regulatory bodies including the CDC and federal and state organizations. These policies and algorithms were followed during the patient's care in the ED.    39 year old male presenting with left hand pain and hiatal hernia.  Ganglion  cyst noted to dorsum of left hand.  Will place in wrist splint for comfort, analgesia and follow-up with orthopedics.  Will start Pepcid and Carafate for burning sensation from hiatal hernia and referred to GI for outpatient follow-up.  Strict return precautions given.  Patient verbalizes understanding agrees with plan of care.      ____________________________________________   FINAL CLINICAL IMPRESSION(S) / ED DIAGNOSES  Final diagnoses:  Ganglion cyst of finger of left hand  Hiatal hernia     ED Discharge Orders         Ordered    HYDROcodone-acetaminophen (NORCO) 5-325 MG tablet  Every 6 hours PRN     05/24/20 0544    sucralfate (CARAFATE) 1 GM/10ML suspension  4 times daily     05/24/20 0544    famotidine (PEPCID) 20 MG tablet  2 times daily     05/24/20 0544           Note:  This document  was prepared using Dragon voice recognition software and may include unintentional dictation errors.   Irean Hong, MD 05/24/20 [phone]

## 2020-05-24 NOTE — ED Notes (Signed)
No answer when called several times from lobby and on cell phone

## 2020-05-24 NOTE — Discharge Instructions (Signed)
1.  You may remove Velcro wrist splint as needed. 2.  You may take Norco as needed for pain. 3.  Start the following medicines daily: Pepcid 20 mg twice daily Carafate 4 times daily 4.  Return to the ER for worsening symptoms, persistent vomiting, difficulty breathing or other concerns.

## 2021-03-26 ENCOUNTER — Emergency Department
Admission: EM | Admit: 2021-03-26 | Discharge: 2021-03-26 | Disposition: A | Discharge status: Home / Self Care | Payer: Self-pay | Attending: Emergency Medicine | Admitting: Emergency Medicine

## 2021-03-26 ENCOUNTER — Other Ambulatory Visit: Payer: Self-pay

## 2021-03-26 ENCOUNTER — Emergency Department: Payer: Self-pay

## 2021-03-26 DIAGNOSIS — R002 Palpitations: Secondary | ICD-10-CM | POA: Insufficient documentation

## 2021-03-26 DIAGNOSIS — F17210 Nicotine dependence, cigarettes, uncomplicated: Secondary | ICD-10-CM | POA: Insufficient documentation

## 2021-03-26 DIAGNOSIS — R000 Tachycardia, unspecified: Secondary | ICD-10-CM | POA: Insufficient documentation

## 2021-03-26 LAB — CBC WITH DIFFERENTIAL/PLATELET
Abs Immature Granulocytes: 0.01 10*3/uL (ref 0.00–0.07)
Basophils Absolute: 0 10*3/uL (ref 0.0–0.1)
Basophils Relative: 1 %
Eosinophils Absolute: 0.2 10*3/uL (ref 0.0–0.5)
Eosinophils Relative: 2 %
HCT: 43.8 % (ref 39.0–52.0)
Hemoglobin: 15.1 g/dL (ref 13.0–17.0)
Immature Granulocytes: 0 %
Lymphocytes Relative: 36 %
Lymphs Abs: 2.3 10*3/uL (ref 0.7–4.0)
MCH: 31.2 pg (ref 26.0–34.0)
MCHC: 34.5 g/dL (ref 30.0–36.0)
MCV: 90.5 fL (ref 80.0–100.0)
Monocytes Absolute: 0.3 10*3/uL (ref 0.1–1.0)
Monocytes Relative: 5 %
Neutro Abs: 3.7 10*3/uL (ref 1.7–7.7)
Neutrophils Relative %: 56 %
Platelets: 278 10*3/uL (ref 150–400)
RBC: 4.84 MIL/uL (ref 4.22–5.81)
RDW: 12.6 % (ref 11.5–15.5)
WBC: 6.6 10*3/uL (ref 4.0–10.5)
nRBC: 0 % (ref 0.0–0.2)

## 2021-03-26 LAB — COMPREHENSIVE METABOLIC PANEL
ALT: 23 U/L (ref 0–44)
AST: 31 U/L (ref 15–41)
Albumin: 4 g/dL (ref 3.5–5.0)
Alkaline Phosphatase: 78 U/L (ref 38–126)
Anion gap: 9 (ref 5–15)
BUN: 16 mg/dL (ref 6–20)
CO2: 24 mmol/L (ref 22–32)
Calcium: 9.3 mg/dL (ref 8.9–10.3)
Chloride: 107 mmol/L (ref 98–111)
Creatinine, Ser: 1.19 mg/dL (ref 0.61–1.24)
GFR, Estimated: >60 mL/min (ref >60)
Glucose, Bld: 158 mg/dL — ABNORMAL HIGH (ref 70–99)
Potassium: 3.5 mmol/L (ref 3.5–5.1)
Sodium: 140 mmol/L (ref 135–145)
Total Bilirubin: 1 mg/dL (ref 0.3–1.2)
Total Protein: 6.8 g/dL (ref 6.5–8.1)

## 2021-03-26 LAB — TROPONIN I (HIGH SENSITIVITY): Troponin I (High Sensitivity): 4 ng/L (ref <18)

## 2021-03-26 LAB — MAGNESIUM: Magnesium: 2.2 mg/dL (ref 1.7–2.4)

## 2021-03-26 MED ORDER — LACTATED RINGERS IV BOLUS
1000.0000 mL | Freq: Once | INTRAVENOUS | Status: AC
  Administered 2021-03-26: 1000 mL via INTRAVENOUS

## 2021-03-26 NOTE — ED Triage Notes (Signed)
Pt arrives via pov states for approx 20 min his heart has been racing,m states a few years ago in prison he was told he had a.fib. pt denies taking any medications currently.

## 2021-03-26 NOTE — ED Notes (Signed)
Pt comes stating he has AFIB and needs to be seen.

## 2021-03-26 NOTE — ED Notes (Signed)
Patient to xray at this time

## 2021-03-26 NOTE — ED Provider Notes (Signed)
[facility] Emergency Department Provider Note  ____________________________________________   Event Date/Time   First MD Initiated Contact with Patient 03/26/21 1531     (approximate)  I have reviewed the triage vital signs and the nursing notes.   HISTORY  Chief Complaint Tachycardia   HPI Darryl Flynn is a 40 y.o. male with past medical history of A. fib diagnosed either in 2019 or 2020 although not currently on antibeta blockers or anticoagulated as well as THC and intermittent tobacco abuse who presents for assessment of some palpitations that he states began approximately 20 minutes prior to arrival.  He states this feels similar to when he had A. fib couple years ago.  He denies any other associated symptoms including headache, earache, sore throat, nausea, vomiting, diarrhea or dysuria, rash or any other recent similar episodes.  He adamantly denies any chest pain stating he just feels like his heart is racing.  He denies any recent significant NSAID use or other illicit drug use.  No other acute concerns at this time.          Past Medical History:  Diagnosis Date  . A-fib ([facility])   . Back ache   . Back pain     Patient Active Problem List   Diagnosis Date Noted  . Atrial fibrillation ([facility]) 04/25/2020  . Hiatal hernia with GERD 04/25/2020    No past surgical history on file.  Prior to Admission medications   Medication Sig Start Date End Date Taking? Authorizing Provider  albuterol (VENTOLIN HFA) 108 (90 Base) MCG/ACT inhaler Inhale 2 puffs into the lungs every 6 (six) hours as needed for up to 7 days for wheezing or shortness of breath. 05/15/20 05/22/20  Concha Se, MD  clindamycin (CLEOCIN) 300 MG capsule Take 1 capsule (300 mg total) by mouth 3 (three) times daily. 11/08/16   Sharyn Creamer, MD  famotidine (PEPCID) 20 MG tablet Take 1 tablet (20 mg total) by mouth 2 (two) times daily. 05/24/20   Irean Hong, MD  HYDROcodone-acetaminophen  (NORCO) 5-325 MG tablet Take 1 tablet by mouth every 6 (six) hours as needed for moderate pain. 05/24/20   Irean Hong, MD  ibuprofen (ADVIL,MOTRIN) 600 MG tablet Take 1 tablet (600 mg total) by mouth every 6 (six) hours as needed. 11/08/16   Sharyn Creamer, MD  sucralfate (CARAFATE) 1 GM/10ML suspension Take 10 mLs (1 g total) by mouth 4 (four) times daily. 05/24/20   Irean Hong, MD    Allergies Citrus  No family history on file.  Social History Social History   Tobacco Use  . Smoking status: Current Every Day Smoker    Packs/day: 0.50    Types: Cigarettes  . Smokeless tobacco: Never Used  Substance Use Topics  . Alcohol use: Yes    Comment: twice a week  . Drug use: Yes    Types: Marijuana    Review of Systems  Review of Systems  Constitutional: Negative for chills and fever.  HENT: Negative for sore throat.   Eyes: Negative for pain.  Respiratory: Negative for cough and stridor.   Cardiovascular: Positive for palpitations. Negative for chest pain.  Gastrointestinal: Negative for vomiting.  Genitourinary: Negative for dysuria.  Musculoskeletal: Negative for myalgias.  Skin: Negative for rash.  Neurological: Negative for seizures, loss of consciousness and headaches.  Psychiatric/Behavioral: Negative for suicidal ideas.  All other systems reviewed and are negative.     ____________________________________________   PHYSICAL EXAM:  VITAL SIGNS:  ED Triage Vitals  Enc Vitals Group     BP      Pulse      Resp      Temp      Temp src      SpO2      Weight      Height      Head Circumference      Peak Flow      Pain Score      Pain Loc      Pain Edu?      Excl. in GC?    Vitals:   03/26/21 1615 03/26/21 1630  BP:  108/69  Pulse: 68 72  Resp: 12 15  Temp:    SpO2: 96% 97%   Physical Exam Vitals and nursing note reviewed.  Constitutional:      Appearance: He is well-developed.  HENT:     Head: Normocephalic and atraumatic.     Right Ear: External  ear normal.     Left Ear: External ear normal.     Nose: Nose normal.  Eyes:     Conjunctiva/sclera: Conjunctivae normal.  Cardiovascular:     Rate and Rhythm: Normal rate and regular rhythm.     Heart sounds: No murmur heard.   Pulmonary:     Effort: Pulmonary effort is normal. No respiratory distress.     Breath sounds: Normal breath sounds.  Abdominal:     Palpations: Abdomen is soft.     Tenderness: There is no abdominal tenderness.  Musculoskeletal:     Cervical back: Neck supple.     Right lower leg: No edema.     Left lower leg: No edema.  Skin:    General: Skin is warm and dry.     Capillary Refill: Capillary refill takes less than 2 seconds.  Neurological:     Mental Status: He is alert.  Psychiatric:        Mood and Affect: Mood normal.      ____________________________________________   LABS (all labs ordered are listed, but only abnormal results are displayed)  Labs Reviewed  COMPREHENSIVE METABOLIC PANEL - Abnormal; Notable for the following components:      Result Value   Glucose, Bld 158 (*)    All other components within normal limits  MAGNESIUM  CBC WITH DIFFERENTIAL/PLATELET  TROPONIN I (HIGH SENSITIVITY)   ____________________________________________  EKG  Initial triage is ECG shows SVT with a rate of 164, normal axis, unremarkable intervals and some nonspecific ST changes in inferior leads.  Repeat ECG when patient rhythm shows sinus tachycardia with ventricular to 108, unremarkable intervals and no clear evidence of acute ischemia or significant underlying arrhythmia other syncope Q wave in aVL.   ____________________________________________  RADIOLOGY  ED MD interpretation: No full consolidation, thorax, effusion, edema, or other clear acute intrathoracic process.   Official radiology report(s): DG Chest 2 View  Result Date: 03/26/2021 CLINICAL DATA:  Cardiac palpitations. EXAM: CHEST - 2 VIEW COMPARISON:  May 15, 2020. FINDINGS: The  heart size and mediastinal contours are within normal limits. Both lungs are clear. The visualized skeletal structures are unremarkable. IMPRESSION: No active cardiopulmonary disease. Electronically Signed   By: Lupita Raider M.D.   On: 03/26/2021 16:46    ____________________________________________   PROCEDURES  Procedure(s) performed (including Critical Care):  .1-3 Lead EKG Interpretation Performed by: Gilles Chiquito, MD Authorized by: Gilles Chiquito, MD     Interpretation: normal     ECG rate assessment: normal  Rhythm: sinus rhythm     Ectopy: none     Conduction: normal       ____________________________________________   INITIAL IMPRESSION / ASSESSMENT AND PLAN / ED COURSE      Patient presents with a stage exam for surveillance of palpitations that began approximate 20 minutes prior to arrival.  In triage his ECG was noted to be SVT with a rate in the 160s.  However upon being roomed repeat ECG shows sinus tach with a rate of 108 with otherwise stable vital signs on room air.  Exam otherwise unremarkable as noted above.  Differential includes paroxysmal SVT versus other paroxysmal arrhythmia, metabolic derangements, ACS, hyperthyroidism, toxic EtOH withdrawal.  No history exam findings suggest acute trauma or acute infectious process.  Low suspicion for PE given paroxysmal nature and resolution without any interventions with patient denying any pain or shortness of breath and no evidence of hypoxia or tachypnea or clear risk factors.  In addition patient is not tremulous diaphoretic and given resolution with some small amount of IV fluids very low suspicion for thyrotoxicosis.   Repeat ECG shows no clear ischemia given troponin for patient denying any chest pain Evalose patient for ACS or myocarditis.  No significant arrhythmia identified on repeat EKG.  CBC shows glucose of 158 with no significant derangements.  CBC is unremarkable and exam is unremarkable.  Chest  x-ray is unremarkable.  Patient given small fluid bolus for possible mild dehydration.  Overall unclear allergy for patient's symptoms although given stable vitals with reassuring exam and work-up and heart rate noted to be between 60 and 80 over approximately 1.5 hours of observation emergency room to believe he is safe for discharge with outpatient cardiology follow-up.  Advised patient to discontinue all tobacco illicit drug use and EtOH follow-up with cardiology.  Referral information provided.  Patient discharged in stable condition.  Strict return precautions advised and discussed.    ____________________________________________   FINAL CLINICAL IMPRESSION(S) / ED DIAGNOSES  Final diagnoses:  Palpitations  Tachycardia    Medications  lactated ringers bolus 1,000 mL (1,000 mLs Intravenous New Bag/Given 03/26/21 1552)     ED Discharge Orders    None       Note:  This document was prepared using Dragon voice recognition software and may include unintentional dictation errors.   Gilles Chiquito, MD 03/26/21 [phone]

## 2021-04-11 ENCOUNTER — Other Ambulatory Visit: Payer: Self-pay

## 2021-04-11 ENCOUNTER — Ambulatory Visit (INDEPENDENT_AMBULATORY_CARE_PROVIDER_SITE_OTHER): Payer: Self-pay | Admitting: Cardiology

## 2021-04-11 ENCOUNTER — Encounter: Payer: Self-pay | Admitting: Cardiology

## 2021-04-11 ENCOUNTER — Ambulatory Visit (INDEPENDENT_AMBULATORY_CARE_PROVIDER_SITE_OTHER): Payer: Self-pay

## 2021-04-11 VITALS — BP 110/70 | HR 66 | Ht 69.0 in | Wt 146.0 lb

## 2021-04-11 DIAGNOSIS — I471 Supraventricular tachycardia: Secondary | ICD-10-CM

## 2021-04-11 DIAGNOSIS — F17200 Nicotine dependence, unspecified, uncomplicated: Secondary | ICD-10-CM

## 2021-04-11 MED ORDER — METOPROLOL SUCCINATE ER 25 MG PO TB24
25.0000 mg | ORAL_TABLET | Freq: Every day | ORAL | 3 refills | Status: DC
  Filled 2021-04-11 – 2021-07-17 (×2): qty 30, 30d supply, fill #0

## 2021-04-11 NOTE — Patient Instructions (Signed)
Medication Instructions:   Your physician has recommended you make the following change in your medication:   1.  START taking Toprol XL (Metoprolol Succinate) 25 MG once a day.  *If you need a refill on your cardiac medications before your next appointment, please call your pharmacy*   Lab Work: None ordered    Testing/Procedures:  1.  Your physician has requested that you have an echocardiogram. Echocardiography is a painless test that uses sound waves to create images of your heart. It provides your doctor with information about the size and shape of your heart and how well your heart's chambers and valves are working. This procedure takes approximately one hour. There are no restrictions for this procedure.  2.  Your physician has recommended that you wear a Zio XT monitor for 2 weeks.   This monitor is a medical device that records the heart's electrical activity. Doctors most often use these monitors to diagnose arrhythmias. Arrhythmias are problems with the speed or rhythm of the heartbeat. The monitor is a small device applied to your chest. You can wear one while you do your normal daily activities. While wearing this monitor if you have any symptoms to push the button and record what you felt. Once you have worn this monitor for the period of time provider prescribed (Usually 14 days), you will return the monitor device in the postage paid box. Once it is returned they will download the data collected and provide [place] with a report which the provider will then review and we will call you with those results. Important tips:  1. Avoid showering during the first 24 hours of wearing the monitor. 2. Avoid excessive sweating to help maximize wear time. 3. Do not submerge the device, no hot tubs, and no swimming pools. 4. Keep any lotions or oils away from the patch. 5. After 24 hours you may shower with the patch on. Take brief showers with your back facing the shower head.  6. Do not  remove patch once it has been placed because that will interrupt data and decrease adhesive wear time. 7. Push the button when you have any symptoms and write down what you were feeling. 8. Once you have completed wearing your monitor, remove and place into box which has postage paid and place in your outgoing mailbox.  9. If for some reason you have misplaced your box then call our office and we can provide another box and/or mail it off for you.        Follow-Up: At [facility], you and your health needs are our priority.  As part of our continuing mission to provide you with exceptional heart care, we have created designated Provider Care Teams.  These Care Teams include your primary Cardiologist (physician) and Advanced Practice Providers (APPs -  Physician Assistants and Nurse Practitioners) who all work together to provide you with the care you need, when you need it.  We recommend signing up for the patient portal called "MyChart".  Sign up information is provided on this After Visit Summary.  MyChart is used to connect with patients for Virtual Visits (Telemedicine).  Patients are able to view lab/test results, encounter notes, upcoming appointments, etc.  Non-urgent messages can be sent to your provider as well.   To learn more about what you can do with MyChart, go to [email or web address].    Your next appointment:   6 week(s)  The format for your next appointment:   In Person  Provider:  Kate Sable, MD   Other Instructions

## 2021-04-11 NOTE — Progress Notes (Signed)
Cardiology Office Note:    Date:  04/11/2021   ID:  Darryl Flynn, DOB 1981/09/05, MRN [id number]  PCP:  [facility]  Cardiologist:  Debbe Odea, MD  Advanced Practice Provider:  No care team member to display Electrophysiologist:  None       Referring MD: Gilles Chiquito, MD   Chief Complaint  Patient presents with  . New Patient (Initial Visit)    Referred by Dr. Katrinka Blazing for Palpitations and Tachycardia    History of Present Illness:    Darryl Flynn is a 40 y.o. male with a hx of paroxysmal atrial fibrillation, current smoker x25+ years who presents due to palpitations.  Symptoms of palpitations worsening over the past 2 months.  He was seen in the ED 03/26/2021 due to palpitations lasting 20 minutes.  States symptoms felt like when he had atrial fibrillation couple of years ago around 2020.  He is not on any anticoagulation or beta-blocker.  He was placed on a heart pill which he does not remember the name.  No objective documentation/EKG showing A. fib noted in the chart.  He typically bears down/performs a Valsalva with resolution of palpitations.  But persisted this time which prompted him to go to the ED.  EKG in the ED showed SVT heart rate 164.  Repeat EKG shows sinus tachycardia.  Past Medical History:  Diagnosis Date  . A-fib (HCC)   . Back ache   . Back pain     History reviewed. No pertinent surgical history.  Current Medications: Current Meds  Medication Sig  . clindamycin (CLEOCIN) 300 MG capsule Take 1 capsule (300 mg total) by mouth 3 (three) times daily.  . famotidine (PEPCID) 20 MG tablet Take 1 tablet (20 mg total) by mouth 2 (two) times daily.  Marland Kitchen HYDROcodone-acetaminophen (NORCO) 5-325 MG tablet Take 1 tablet by mouth every 6 (six) hours as needed for moderate pain.  Marland Kitchen ibuprofen (ADVIL,MOTRIN) 600 MG tablet Take 1 tablet (600 mg total) by mouth every 6 (six) hours as needed.  . metoprolol succinate (TOPROL XL)  25 MG 24 hr tablet Take 1 tablet (25 mg total) by mouth daily.  . sucralfate (CARAFATE) 1 GM/10ML suspension Take 10 mLs (1 g total) by mouth 4 (four) times daily.     Allergies:   Citrus   Social History   Socioeconomic History  . Marital status: Single    Spouse name: Not on file  . Number of children: Not on file  . Years of education: Not on file  . Highest education level: Not on file  Occupational History  . Not on file  Tobacco Use  . Smoking status: Current Some Day Smoker    Packs/day: 0.50    Types: Cigarettes  . Smokeless tobacco: Never Used  Substance and Sexual Activity  . Alcohol use: Yes    Comment: twice a week  . Drug use: Yes    Types: Marijuana  . Sexual activity: Not on file  Other Topics Concern  . Not on file  Social History Narrative  . Not on file   Social Determinants of Health   Financial Resource Strain: Not on file  Food Insecurity: Not on file  Transportation Needs: Not on file  Physical Activity: Not on file  Stress: Not on file  Social Connections: Not on file     Family History: The patient's family history is not on file.  ROS:   Please see the history  of present illness.     All other systems reviewed and are negative.  EKGs/Labs/Other Studies Reviewed:    The following studies were reviewed today:   EKG:  EKG is  ordered today.  The ekg ordered today demonstrates normal sinus rhythm, normal ECG  Recent Labs: 03/26/2021: ALT 23; BUN 16; Creatinine, Ser 1.19; Hemoglobin 15.1; Magnesium 2.2; Platelets 278; Potassium 3.5; Sodium 140  Recent Lipid Panel No results found for: CHOL, TRIG, HDL, CHOLHDL, VLDL, LDLCALC, LDLDIRECT   Risk Assessment/Calculations:      Physical Exam:    VS:  BP 110/70   Pulse 66   Ht 5\' 9"  (1.753 m)   Wt 146 lb (66.2 kg)   BMI 21.56 kg/m     Wt Readings from Last 3 Encounters:  04/11/21 146 lb (66.2 kg)  03/26/21 146 lb (66.2 kg)  05/24/20 150 lb (68 kg)     GEN:  Well nourished,  well developed in no acute distress HEENT: Normal NECK: No JVD; No carotid bruits LYMPHATICS: No lymphadenopathy CARDIAC: RRR, no murmurs, rubs, gallops RESPIRATORY:  Clear to auscultation without rales, wheezing or rhonchi  ABDOMEN: Soft, non-tender, non-distended MUSCULOSKELETAL:  No edema; No deformity  SKIN: Warm and dry NEUROLOGIC:  Alert and oriented x 3 PSYCHIATRIC:  Normal affect   ASSESSMENT:    1. Paroxysmal SVT (supraventricular tachycardia) (HCC)   2. Smoking    PLAN:    In order of problems listed above:  1. Paroxysmal SVT, start Toprol-XL, get echocardiogram.  No objective evidence of atrial fibrillation noted in the chart.  Place cardiac monitor.  We will reach out to [facility] facility to see if patient has any documented A. fib.  Refer patient to EP. 2. Current smoker, cessation advised.  Follow-up after echocardiogram and cardiac monitor.     Medication Adjustments/Labs and Tests Ordered: Current medicines are reviewed at length with the patient today.  Concerns regarding medicines are outlined above.  Orders Placed This Encounter  Procedures  . Ambulatory referral to Cardiac Electrophysiology  . LONG TERM MONITOR (3-14 DAYS)  . EKG 12-Lead  . ECHOCARDIOGRAM COMPLETE   Meds ordered this encounter  Medications  . metoprolol succinate (TOPROL XL) 25 MG 24 hr tablet    Sig: Take 1 tablet (25 mg total) by mouth daily.    Dispense:  30 tablet    Refill:  3    Patient Instructions  Medication Instructions:   Your physician has recommended you make the following change in your medication:   1.  START taking Toprol XL (Metoprolol Succinate) 25 MG once a day.  *If you need a refill on your cardiac medications before your next appointment, please call your pharmacy*   Lab Work: None ordered    Testing/Procedures:  1.  Your physician has requested that you have an echocardiogram. Echocardiography is a painless test that uses sound waves to  create images of your heart. It provides your doctor with information about the size and shape of your heart and how well your heart's chambers and valves are working. This procedure takes approximately one hour. There are no restrictions for this procedure.  2.  Your physician has recommended that you wear a Zio XT monitor for 2 weeks.   This monitor is a medical device that records the heart's electrical activity. Doctors most often use these monitors to diagnose arrhythmias. Arrhythmias are problems with the speed or rhythm of the heartbeat. The monitor is a small device applied to your chest. You  can wear one while you do your normal daily activities. While wearing this monitor if you have any symptoms to push the button and record what you felt. Once you have worn this monitor for the period of time provider prescribed (Usually 14 days), you will return the monitor device in the postage paid box. Once it is returned they will download the data collected and provide [place] with a report which the provider will then review and we will call you with those results. Important tips:  1. Avoid showering during the first 24 hours of wearing the monitor. 2. Avoid excessive sweating to help maximize wear time. 3. Do not submerge the device, no hot tubs, and no swimming pools. 4. Keep any lotions or oils away from the patch. 5. After 24 hours you may shower with the patch on. Take brief showers with your back facing the shower head.  6. Do not remove patch once it has been placed because that will interrupt data and decrease adhesive wear time. 7. Push the button when you have any symptoms and write down what you were feeling. 8. Once you have completed wearing your monitor, remove and place into box which has postage paid and place in your outgoing mailbox.  9. If for some reason you have misplaced your box then call our office and we can provide another box and/or mail it off for  you.        Follow-Up: At [facility], you and your health needs are our priority.  As part of our continuing mission to provide you with exceptional heart care, we have created designated Provider Care Teams.  These Care Teams include your primary Cardiologist (physician) and Advanced Practice Providers (APPs -  Physician Assistants and Nurse Practitioners) who all work together to provide you with the care you need, when you need it.  We recommend signing up for the patient portal called "MyChart".  Sign up information is provided on this After Visit Summary.  MyChart is used to connect with patients for Virtual Visits (Telemedicine).  Patients are able to view lab/test results, encounter notes, upcoming appointments, etc.  Non-urgent messages can be sent to your provider as well.   To learn more about what you can do with MyChart, go to [email or web address].    Your next appointment:   6 week(s)  The format for your next appointment:   In Person  Provider:   Debbe Odea, MD   Other Instructions      Signed, Debbe Odea, MD  04/11/2021 1:01 PM    [facility]

## 2021-04-14 ENCOUNTER — Telehealth: Payer: Self-pay | Admitting: Cardiology

## 2021-04-14 ENCOUNTER — Other Ambulatory Visit: Payer: Self-pay

## 2021-04-14 NOTE — Telephone Encounter (Signed)
Patient states that he is having some extreme itching where monitor is at. Advised that he should take some benadryl and he states he can not afford that and worst case he will go to ED. Advised that would be way more expensive than the cost of a benadryl pill which can be purchased at [facility] for $0.88 and that if he goes to ED that will be the most expensive benadryl pill ever. Recommended that he check [employer] for lower cost option because that would be more reasonable and to continue wearing monitor. He verbalized understanding with no further questions at this time.

## 2021-04-14 NOTE — Telephone Encounter (Signed)
Patient calling to discuss itching irritation and some adhesion issues with zio.  Patient is not sure if he should go to the ED.

## 2021-04-25 ENCOUNTER — Other Ambulatory Visit: Payer: Self-pay

## 2021-04-29 ENCOUNTER — Telehealth: Payer: Self-pay

## 2021-04-29 NOTE — Telephone Encounter (Signed)
Need to reschedule 04/30/21 appt with Dr. Lalla Brothers per triage nurse, [facility], RN. Do not have ZIO monitor results. Consult for SVT. Unable to reach patient-voice mailbox not set up yet. Front office staff has been informed to reschedule appointment and echo if patient calls back or shows up for appointment tomorrow.

## 2021-04-29 NOTE — Telephone Encounter (Signed)
Call attempted to reschedule echo appointment (no show) and check on status of monitor return however patient has not set up voice mailbox yet-unable to leave message.

## 2021-04-30 ENCOUNTER — Institutional Professional Consult (permissible substitution): Payer: Self-pay | Admitting: Cardiology

## 2021-04-30 NOTE — Telephone Encounter (Signed)
[name] ( not on DPR calling office to reschedule visit)  Instructed to relay to patient to call office .   appt cancelled per provider request.

## 2021-05-27 ENCOUNTER — Encounter: Payer: Self-pay | Admitting: Cardiology

## 2021-05-29 ENCOUNTER — Ambulatory Visit: Payer: Self-pay | Admitting: Cardiology

## 2021-06-11 ENCOUNTER — Other Ambulatory Visit: Payer: Self-pay

## 2021-06-16 ENCOUNTER — Ambulatory Visit: Payer: Self-pay | Admitting: Cardiology

## 2021-07-11 ENCOUNTER — Other Ambulatory Visit: Payer: Self-pay

## 2021-07-11 ENCOUNTER — Emergency Department
Admission: EM | Admit: 2021-07-11 | Discharge: 2021-07-11 | Disposition: A | Discharge status: Home / Self Care | Payer: Self-pay | Attending: Emergency Medicine | Admitting: Emergency Medicine

## 2021-07-11 ENCOUNTER — Encounter: Payer: Self-pay | Admitting: Emergency Medicine

## 2021-07-11 DIAGNOSIS — R112 Nausea with vomiting, unspecified: Secondary | ICD-10-CM

## 2021-07-11 DIAGNOSIS — R1013 Epigastric pain: Secondary | ICD-10-CM | POA: Insufficient documentation

## 2021-07-11 DIAGNOSIS — K921 Melena: Secondary | ICD-10-CM | POA: Insufficient documentation

## 2021-07-11 DIAGNOSIS — F17210 Nicotine dependence, cigarettes, uncomplicated: Secondary | ICD-10-CM | POA: Insufficient documentation

## 2021-07-11 DIAGNOSIS — K920 Hematemesis: Secondary | ICD-10-CM | POA: Insufficient documentation

## 2021-07-11 LAB — CBC WITH DIFFERENTIAL/PLATELET
Abs Immature Granulocytes: 0.01 10*3/uL (ref 0.00–0.07)
Basophils Absolute: 0 10*3/uL (ref 0.0–0.1)
Basophils Relative: 1 %
Eosinophils Absolute: 0.2 10*3/uL (ref 0.0–0.5)
Eosinophils Relative: 3 %
HCT: 42.3 % (ref 39.0–52.0)
Hemoglobin: 14.7 g/dL (ref 13.0–17.0)
Immature Granulocytes: 0 %
Lymphocytes Relative: 50 %
Lymphs Abs: 2.8 10*3/uL (ref 0.7–4.0)
MCH: 31.8 pg (ref 26.0–34.0)
MCHC: 34.8 g/dL (ref 30.0–36.0)
MCV: 91.6 fL (ref 80.0–100.0)
Monocytes Absolute: 0.2 10*3/uL (ref 0.1–1.0)
Monocytes Relative: 4 %
Neutro Abs: 2.3 10*3/uL (ref 1.7–7.7)
Neutrophils Relative %: 42 %
Platelets: 252 10*3/uL (ref 150–400)
RBC: 4.62 MIL/uL (ref 4.22–5.81)
RDW: 12.5 % (ref 11.5–15.5)
WBC: 5.6 10*3/uL (ref 4.0–10.5)
nRBC: 0 % (ref 0.0–0.2)

## 2021-07-11 LAB — COMPREHENSIVE METABOLIC PANEL
ALT: 19 U/L (ref 0–44)
AST: 20 U/L (ref 15–41)
Albumin: 3.5 g/dL (ref 3.5–5.0)
Alkaline Phosphatase: 52 U/L (ref 38–126)
Anion gap: 6 (ref 5–15)
BUN: 15 mg/dL (ref 6–20)
CO2: 24 mmol/L (ref 22–32)
Calcium: 8.7 mg/dL — ABNORMAL LOW (ref 8.9–10.3)
Chloride: 112 mmol/L — ABNORMAL HIGH (ref 98–111)
Creatinine, Ser: 0.99 mg/dL (ref 0.61–1.24)
GFR, Estimated: >60 mL/min (ref >60)
Glucose, Bld: 103 mg/dL — ABNORMAL HIGH (ref 70–99)
Potassium: 3.8 mmol/L (ref 3.5–5.1)
Sodium: 142 mmol/L (ref 135–145)
Total Bilirubin: 0.7 mg/dL (ref 0.3–1.2)
Total Protein: 5.9 g/dL — ABNORMAL LOW (ref 6.5–8.1)

## 2021-07-11 LAB — URINALYSIS, COMPLETE (UACMP) WITH MICROSCOPIC
Bacteria, UA: NONE SEEN
Bilirubin Urine: NEGATIVE
Glucose, UA: NEGATIVE mg/dL
Hgb urine dipstick: NEGATIVE
Ketones, ur: NEGATIVE mg/dL
Leukocytes,Ua: NEGATIVE
Nitrite: NEGATIVE
Protein, ur: NEGATIVE mg/dL
Specific Gravity, Urine: 1.024 (ref 1.005–1.030)
pH: 5 (ref 5.0–8.0)

## 2021-07-11 LAB — TYPE AND SCREEN
ABO/RH(D): B POS
Antibody Screen: NEGATIVE

## 2021-07-11 LAB — TROPONIN I (HIGH SENSITIVITY)
Troponin I (High Sensitivity): 3 ng/L (ref <18)
Troponin I (High Sensitivity): 3 ng/L (ref <18)

## 2021-07-11 LAB — LIPASE, BLOOD: Lipase: 47 U/L (ref 11–51)

## 2021-07-11 MED ORDER — PANTOPRAZOLE SODIUM 40 MG PO TBEC
40.0000 mg | DELAYED_RELEASE_TABLET | Freq: Every day | ORAL | 0 refills | Status: AC
  Filled 2021-07-11 – 2021-07-17 (×2): qty 30, 30d supply, fill #0

## 2021-07-11 MED ORDER — SUCRALFATE 1 G PO TABS
1.0000 g | ORAL_TABLET | Freq: Four times a day (QID) | ORAL | 0 refills | Status: AC
  Filled 2021-07-11 – 2021-07-17 (×2): qty 120, 30d supply, fill #0

## 2021-07-11 MED ORDER — ALBUTEROL SULFATE HFA 108 (90 BASE) MCG/ACT IN AERS
2.0000 | INHALATION_SPRAY | Freq: Four times a day (QID) | RESPIRATORY_TRACT | 0 refills | Status: DC | PRN
  Filled 2021-07-11: qty 6.7, 25d supply, fill #0
  Filled 2021-07-17: qty 6.7, 7d supply, fill #0

## 2021-07-11 NOTE — ED Notes (Signed)
PT reports GERD for years.  States he has been taking TUMS over the counter and is taking more than normal.  PT states other medications OTC do not work and that he has not changed his diet.

## 2021-07-11 NOTE — ED Provider Notes (Signed)
[facility] Emergency Department Provider Note  ____________________________________________   Event Date/Time   First MD Initiated Contact with Patient 07/11/21 0900     (approximate)  I have reviewed the triage vital signs and the nursing notes.   HISTORY  Chief Complaint Abdominal Pain    HPI Darryl Flynn is a 40 y.o. male  with h/o AFib (not on anticoagulation), here with abdominal pain, nausea. Pt reports that for the past several weeks, he's had progressively worsening, daily,aching and burning epigastric abdominal pain. Pt states that his sx are daily, constant, and worse w/ eating, drinking. He has transient relief with tums only. He states the sx are present throughout the day but worsen w/ eating. He states that occasionally after episode of increased emesis, he has had some blood-streaked emesis. He has also noticed blood in his stool occasionally though not constantly. No consistent melena. No lightheadedness, dizziness. No other complaints. He was referred to GI previously but has not followed up. He has not been taking any of his other medications.       Past Medical History:  Diagnosis Date   A-fib [facility])    Back ache    Back pain     Patient Active Problem List   Diagnosis Date Noted   Atrial fibrillation (HCC) 04/25/2020   Hiatal hernia with GERD 04/25/2020    History reviewed. No pertinent surgical history.  Prior to Admission medications   Medication Sig Start Date End Date Taking? Authorizing Provider  pantoprazole (PROTONIX) 40 MG tablet Take 1 tablet (40 mg total) by mouth daily. 07/11/21 08/10/21 Yes Shaune Pollack, MD  sucralfate (CARAFATE) 1 g tablet Take 1 tablet (1 g total) by mouth 4 (four) times daily. 07/11/21 08/10/21 Yes Shaune Pollack, MD  albuterol (VENTOLIN HFA) 108 (90 Base) MCG/ACT inhaler Inhale 2 puffs into the lungs every 6 (six) hours as needed for up to 7 days for wheezing or shortness of breath. 07/11/21  08/05/21  Shaune Pollack, MD  clindamycin (CLEOCIN) 300 MG capsule Take 1 capsule (300 mg total) by mouth 3 (three) times daily. 11/08/16   Sharyn Creamer, MD  famotidine (PEPCID) 20 MG tablet Take 1 tablet (20 mg total) by mouth 2 (two) times daily. 05/24/20   Irean Hong, MD  HYDROcodone-acetaminophen (NORCO) 5-325 MG tablet Take 1 tablet by mouth every 6 (six) hours as needed for moderate pain. 05/24/20   Irean Hong, MD  ibuprofen (ADVIL,MOTRIN) 600 MG tablet Take 1 tablet (600 mg total) by mouth every 6 (six) hours as needed. 11/08/16   Sharyn Creamer, MD  metoprolol succinate (TOPROL XL) 25 MG 24 hr tablet Take 1 tablet (25 mg total) by mouth daily. 04/11/21   Debbe Odea, MD    Allergies Citrus  No family history on file.  Social History Social History   Tobacco Use   Smoking status: Some Days    Packs/day: 0.50    Types: Cigarettes   Smokeless tobacco: Never  Vaping Use   Vaping Use: Never used  Substance Use Topics   Alcohol use: Yes    Comment: twice a week   Drug use: Yes    Types: Marijuana    Review of Systems  Review of Systems  Constitutional:  Negative for chills, fatigue and fever.  HENT:  Negative for sore throat.   Respiratory:  Negative for shortness of breath.   Cardiovascular:  Negative for chest pain.  Gastrointestinal:  Positive for abdominal pain, nausea and vomiting.  Genitourinary:  Negative for flank pain.  Musculoskeletal:  Negative for neck pain.  Skin:  Negative for rash and wound.  Allergic/Immunologic: Negative for immunocompromised state.  Neurological:  Negative for weakness and numbness.  Hematological:  Does not bruise/bleed easily.    ____________________________________________  PHYSICAL EXAM:      VITAL SIGNS: ED Triage Vitals  Enc Vitals Group     BP 07/11/21 0236 114/85     Pulse Rate 07/11/21 0236 65     Resp 07/11/21 0236 18     Temp 07/11/21 0236 97.7 F (36.5 C)     Temp Source 07/11/21 0236 Oral     SpO2 07/11/21  0236 97 %     Weight 07/11/21 0234 150 lb (68 kg)     Height 07/11/21 0234 5\' 9"  (1.753 m)     Head Circumference --      Peak Flow --      Pain Score 07/11/21 0234 10     Pain Loc --      Pain Edu? --      Excl. in GC? --      Physical Exam Vitals and nursing note reviewed.  Constitutional:      General: He is not in acute distress.    Appearance: He is well-developed.  HENT:     Head: Normocephalic and atraumatic.  Eyes:     Conjunctiva/sclera: Conjunctivae normal.  Cardiovascular:     Rate and Rhythm: Normal rate and regular rhythm.     Heart sounds: Normal heart sounds. No murmur heard.   No friction rub.  Pulmonary:     Effort: Pulmonary effort is normal. No respiratory distress.     Breath sounds: Normal breath sounds. No wheezing or rales.  Abdominal:     General: There is no distension.     Palpations: Abdomen is soft.     Tenderness: There is abdominal tenderness (minimal) in the epigastric area. There is no guarding or rebound.  Musculoskeletal:     Cervical back: Neck supple.  Skin:    General: Skin is warm.     Capillary Refill: Capillary refill takes less than 2 seconds.  Neurological:     Mental Status: He is alert and oriented to person, place, and time.     Motor: No abnormal muscle tone.      ____________________________________________   LABS (all labs ordered are listed, but only abnormal results are displayed)  Labs Reviewed  COMPREHENSIVE METABOLIC PANEL - Abnormal; Notable for the following components:      Result Value   Chloride 112 (*)    Glucose, Bld 103 (*)    Calcium 8.7 (*)    Total Protein 5.9 (*)    All other components within normal limits  URINALYSIS, COMPLETE (UACMP) WITH MICROSCOPIC - Abnormal; Notable for the following components:   Color, Urine YELLOW (*)    APPearance CLEAR (*)    All other components within normal limits  CBC WITH DIFFERENTIAL/PLATELET  LIPASE, BLOOD  TYPE AND SCREEN  TROPONIN I (HIGH SENSITIVITY)   TROPONIN I (HIGH SENSITIVITY)    ____________________________________________  EKG: Normal sinus rhythm, VR 62. PR 210, QRS 82, QTc 381. No acute ST elevations or depressions. No ischemia or infarct. ________________________________________  RADIOLOGY All imaging, including plain films, CT scans, and ultrasounds, independently reviewed by me, and interpretations confirmed via formal radiology reads.  ED MD interpretation:   None  Official radiology report(s): No results found.  ____________________________________________  PROCEDURES   Procedure(s) performed (including Critical  Care):  Procedures  ____________________________________________  INITIAL IMPRESSION / MDM / ASSESSMENT AND PLAN / ED COURSE  As part of my medical decision making, I reviewed the following data within the electronic MEDICAL RECORD NUMBER Nursing notes reviewed and incorporated, Old chart reviewed, Notes from prior ED visits, and [place] Controlled Substance Database       *Darryl Flynn was evaluated in Emergency Department on 07/11/2021 for the symptoms described in the history of present illness. He was evaluated in the context of the global COVID-19 pandemic, which necessitated consideration that the patient might be at risk for infection with the SARS-CoV-2 virus that causes COVID-19. Institutional protocols and algorithms that pertain to the evaluation of patients at risk for COVID-19 are in a state of rapid change based on information released by regulatory bodies including the CDC and federal and state organizations. These policies and algorithms were followed during the patient's care in the ED.  Some ED evaluations and interventions may be delayed as a result of limited staffing during the pandemic.*     Medical Decision Making: 40 yo M here with epigastric pain, n/v, intermittent reported hematemesis and blood in stools. Clinically, suspect ongoing, untreated GERD/gastritis, DDx including PUD. Pt is  not anemic, has normal BUN, is not on anticoagulation, and is HDS with no signs of significant ongoing blood loss at this time. Low-risk for complications of bleeding at this time. No vomiting despite >8 hr in ED. Otherwise, abd soft, NT, with no evidence to suggest perforation or obstruction. CMP with normal LFTs and renal function. Lipase normal. Low risk Glasgow-Blatchford score.  Advised pt to stop all ASA/NSAIDs, will start on PPI/Carafate and refer to PCP and GI. Return precautions given.  ____________________________________________  FINAL CLINICAL IMPRESSION(S) / ED DIAGNOSES  Final diagnoses:  Epigastric pain  Non-intractable vomiting with nausea, unspecified vomiting type     MEDICATIONS GIVEN DURING THIS VISIT:  Medications - No data to display   ED Discharge Orders          Ordered    pantoprazole (PROTONIX) 40 MG tablet  Daily        07/11/21 0942    albuterol (VENTOLIN HFA) 108 (90 Base) MCG/ACT inhaler  Every 6 hours PRN        07/11/21 0942    sucralfate (CARAFATE) 1 g tablet  4 times daily        07/11/21 0350             Note:  This document was prepared using Dragon voice recognition software and may include unintentional dictation errors.   Shaune Pollack, MD 07/11/21 [id number]

## 2021-07-11 NOTE — ED Triage Notes (Signed)
Patient ambulatory to triage with steady gait, without difficulty or distress noted; pt reports upper abd pain accomp by rectal bleeding and hematemesis

## 2021-07-17 ENCOUNTER — Other Ambulatory Visit: Payer: Self-pay

## 2021-07-22 ENCOUNTER — Emergency Department
Admission: EM | Admit: 2021-07-22 | Discharge: 2021-07-22 | Disposition: A | Discharge status: Home / Self Care | Payer: Self-pay | Attending: Emergency Medicine | Admitting: Emergency Medicine

## 2021-07-22 ENCOUNTER — Other Ambulatory Visit: Payer: Self-pay

## 2021-07-22 ENCOUNTER — Emergency Department: Payer: Self-pay

## 2021-07-22 DIAGNOSIS — R200 Anesthesia of skin: Secondary | ICD-10-CM | POA: Insufficient documentation

## 2021-07-22 DIAGNOSIS — M25512 Pain in left shoulder: Secondary | ICD-10-CM | POA: Insufficient documentation

## 2021-07-22 DIAGNOSIS — M5410 Radiculopathy, site unspecified: Secondary | ICD-10-CM | POA: Insufficient documentation

## 2021-07-22 DIAGNOSIS — F17210 Nicotine dependence, cigarettes, uncomplicated: Secondary | ICD-10-CM | POA: Insufficient documentation

## 2021-07-22 LAB — CBC
HCT: 38.8 % — ABNORMAL LOW (ref 39.0–52.0)
Hemoglobin: 13.5 g/dL (ref 13.0–17.0)
MCH: 32.5 pg (ref 26.0–34.0)
MCHC: 34.8 g/dL (ref 30.0–36.0)
MCV: 93.5 fL (ref 80.0–100.0)
Platelets: 239 10*3/uL (ref 150–400)
RBC: 4.15 MIL/uL — ABNORMAL LOW (ref 4.22–5.81)
RDW: 12.5 % (ref 11.5–15.5)
WBC: 4.3 10*3/uL (ref 4.0–10.5)
nRBC: 0 % (ref 0.0–0.2)

## 2021-07-22 LAB — BASIC METABOLIC PANEL
Anion gap: 5 (ref 5–15)
BUN: 15 mg/dL (ref 6–20)
CO2: 27 mmol/L (ref 22–32)
Calcium: 8.7 mg/dL — ABNORMAL LOW (ref 8.9–10.3)
Chloride: 108 mmol/L (ref 98–111)
Creatinine, Ser: 1.15 mg/dL (ref 0.61–1.24)
GFR, Estimated: >60 mL/min (ref >60)
Glucose, Bld: 101 mg/dL — ABNORMAL HIGH (ref 70–99)
Potassium: 3.7 mmol/L (ref 3.5–5.1)
Sodium: 140 mmol/L (ref 135–145)

## 2021-07-22 LAB — TROPONIN I (HIGH SENSITIVITY): Troponin I (High Sensitivity): 3 ng/L (ref <18)

## 2021-07-22 MED ORDER — PREDNISONE 10 MG PO TABS
10.0000 mg | ORAL_TABLET | Freq: Every day | ORAL | 0 refills | Status: DC
  Filled 2021-07-22: qty 30, 12d supply, fill #0

## 2021-07-22 MED ORDER — CYCLOBENZAPRINE HCL 5 MG PO TABS
5.0000 mg | ORAL_TABLET | Freq: Three times a day (TID) | ORAL | 0 refills | Status: DC | PRN
  Filled 2021-07-22: qty 20, 7d supply, fill #0

## 2021-07-22 NOTE — ED Provider Notes (Signed)
[facility] Emergency Department Provider Note  Time seen: 5:36 PM  I have reviewed the triage vital signs and the nursing notes.   HISTORY  Chief Complaint Numbness   HPI Darryl Figueira is a 40 y.o. male with a past medical history of atrial fibrillation, presents to the emergency department for pain/numbness left upper extremity.  According to the patient over the past several months he has been experiencing intermittent pain/numbness to the left upper extremity.  According to the patient for the last several months he has been experiencing intermittent numbness and pain to the left shoulder and left upper extremity.  Denies any weakness.  Denies any numbness or weakness to the face or leg.  Patient states the pain is worse with movement of the arm and occasionally will feel a sharp shooting pain on the left neck down the left shoulder and sometimes into the left arm.  Denies any fever, largely negative review of systems otherwise.   Past Medical History:  Diagnosis Date   A-fib [facility])    Back ache    Back pain     Patient Active Problem List   Diagnosis Date Noted   Atrial fibrillation (HCC) 04/25/2020   Hiatal hernia with GERD 04/25/2020    No past surgical history on file.  Prior to Admission medications   Medication Sig Start Date End Date Taking? Authorizing Provider  albuterol (VENTOLIN HFA) 108 (90 Base) MCG/ACT inhaler Inhale 2 puffs into the lungs every 6 (six) hours as needed for wheezing or shortness of breath for up to 7 days. 07/11/21 07/24/21  Shaune Pollack, MD  clindamycin (CLEOCIN) 300 MG capsule Take 1 capsule (300 mg total) by mouth 3 (three) times daily. 11/08/16   Sharyn Creamer, MD  famotidine (PEPCID) 20 MG tablet Take 1 tablet (20 mg total) by mouth 2 (two) times daily. 05/24/20   Irean Hong, MD  HYDROcodone-acetaminophen (NORCO) 5-325 MG tablet Take 1 tablet by mouth every 6 (six) hours as needed for moderate pain. 05/24/20   Irean Hong, MD  ibuprofen (ADVIL,MOTRIN) 600 MG tablet Take 1 tablet (600 mg total) by mouth every 6 (six) hours as needed. 11/08/16   Sharyn Creamer, MD  metoprolol succinate (TOPROL XL) 25 MG 24 hr tablet Take 1 tablet (25 mg total) by mouth daily. 04/11/21   Debbe Odea, MD  pantoprazole (PROTONIX) 40 MG tablet Take 1 tablet (40 mg total) by mouth daily. 07/11/21 08/16/21  Shaune Pollack, MD  sucralfate (CARAFATE) 1 g tablet Take 1 tablet (1 g total) by mouth 4 (four) times daily. 07/11/21 08/16/21  Shaune Pollack, MD    Allergies  Allergen Reactions   Citrus     Lips swell and throat itches.    No family history on file.  Social History Social History   Tobacco Use   Smoking status: Some Days    Packs/day: 0.50    Types: Cigarettes   Smokeless tobacco: Never  Vaping Use   Vaping Use: Never used  Substance Use Topics   Alcohol use: Yes    Comment: twice a week   Drug use: Yes    Types: Marijuana    Review of Systems Constitutional: Negative for fever. Cardiovascular: Left chest discomfort worse with movement of left shoulder/left arm Respiratory: Negative for shortness of breath.  Negative for cough. Gastrointestinal: Negative for abdominal pain, vomiting  Musculoskeletal: Negative for musculoskeletal complaints Skin: Negative for skin complaints  Neurological: Negative for headache All other ROS negative ____________________________________________  PHYSICAL EXAM:  VITAL SIGNS: ED Triage Vitals  Enc Vitals Group     BP 07/22/21 1455 128/90     Pulse Rate 07/22/21 1455 74     Resp 07/22/21 1455 16     Temp 07/22/21 1455 98.1 F (36.7 C)     Temp Source 07/22/21 1455 Oral     SpO2 07/22/21 1455 100 %     Weight 07/22/21 1456 149 lb 14.6 oz (68 kg)     Height 07/22/21 1456 5\' 9"  (1.753 m)     Head Circumference --      Peak Flow --      Pain Score 07/22/21 1456 3     Pain Loc --      Pain Edu? --      Excl. in GC? --    Constitutional: Alert and oriented.  Well appearing and in no distress. Eyes: Normal exam ENT      Head: Normocephalic and atraumatic.      Mouth/Throat: Mucous membranes are moist. Cardiovascular: Normal rate, regular rhythm.  Respiratory: Normal respiratory effort without tachypnea nor retractions. Breath sounds are clear Gastrointestinal: Soft and nontender. No distention. Musculoskeletal: Mild to moderate tenderness of the left shoulder with pain with range of motion.  Neuro vastly intact. Neurologic: Moderate tenderness of the left shoulder with mild pain with range of motion.  Neuro vastly intact distally.  No pronator drift.  Equal grip strengths.  No cranial nerve deficits. Skin:  Skin is warm, dry and intact.  Psychiatric: Mood and affect are normal.   ____________________________________________    EKG  EKG viewed and interpreted by myself shows a sinus rhythm at 70 bpm with a narrow QRS, normal axis, slight PR prolongation otherwise normal intervals.  No concerning ST changes.  ____________________________________________    RADIOLOGY  Chest x-ray is negative  ____________________________________________   INITIAL IMPRESSION / ASSESSMENT AND PLAN / ED COURSE  Pertinent labs & imaging results that were available during my care of the patient were reviewed by me and considered in my medical decision making (see chart for details).   Patient presents to the emergency department for several months of intermittent pain and occasionally numbness/tingling of the left upper extremity and into the left chest.  States it is worse with movement of the left shoulder and will occasionally have sharp shooting pains from the neck down the left arm.  Symptoms are suggestive of radicular pain.  Intact neurologic exam do not suspect central nervous system issue.  Patient's lab work is reassuring including negative troponin.  EKG is reassuring.  Chest x-ray is negative.  Given the patient's reassuring work-up I believe a trial  of prednisone and Flexeril for the discomfort is warranted.  Discussed PCP follow-up as well as return precautions.  Patient agreeable to plan of care.  Gracin Tetreault was evaluated in Emergency Department on 07/22/2021 for the symptoms described in the history of present illness. He was evaluated in the context of the global COVID-19 pandemic, which necessitated consideration that the patient might be at risk for infection with the SARS-CoV-2 virus that causes COVID-19. Institutional protocols and algorithms that pertain to the evaluation of patients at risk for COVID-19 are in a state of rapid change based on information released by regulatory bodies including the CDC and federal and state organizations. These policies and algorithms were followed during the patient's care in the ED.  ____________________________________________   FINAL CLINICAL IMPRESSION(S) / ED DIAGNOSES  Radicular pain   09/21/2021, MD  07/22/21 1749  

## 2021-07-22 NOTE — ED Triage Notes (Signed)
Pt to ED for left chest and left arm feeling similar to numbness since 0300 am. States left side of chest feels weird but cannot describe it. Equal grip and strength. Clear speech. Ambulatory with steady gait  Chest pain protocols per Dr Cyril Loosen

## 2021-07-23 ENCOUNTER — Other Ambulatory Visit: Payer: Self-pay

## 2021-09-03 ENCOUNTER — Other Ambulatory Visit: Payer: Self-pay

## 2021-09-13 ENCOUNTER — Other Ambulatory Visit: Payer: Self-pay

## 2021-09-13 ENCOUNTER — Emergency Department
Admission: EM | Admit: 2021-09-13 | Discharge: 2021-09-13 | Disposition: A | Discharge status: Home / Self Care | Payer: Self-pay | Attending: Emergency Medicine | Admitting: Emergency Medicine

## 2021-09-13 DIAGNOSIS — M5412 Radiculopathy, cervical region: Secondary | ICD-10-CM | POA: Insufficient documentation

## 2021-09-13 DIAGNOSIS — K219 Gastro-esophageal reflux disease without esophagitis: Secondary | ICD-10-CM | POA: Insufficient documentation

## 2021-09-13 DIAGNOSIS — L720 Epidermal cyst: Secondary | ICD-10-CM | POA: Insufficient documentation

## 2021-09-13 DIAGNOSIS — F17210 Nicotine dependence, cigarettes, uncomplicated: Secondary | ICD-10-CM | POA: Insufficient documentation

## 2021-09-13 MED ORDER — PREDNISONE 10 MG PO TABS: 10.0000 mg | ORAL_TABLET | ORAL | 0 refills | Status: DC

## 2021-09-13 MED ORDER — PANTOPRAZOLE SODIUM 40 MG PO TBEC: 40.0000 mg | DELAYED_RELEASE_TABLET | Freq: Every day | ORAL | Status: DC

## 2021-09-13 MED ORDER — OMEPRAZOLE 10 MG PO CPDR: 10.0000 mg | DELAYED_RELEASE_CAPSULE | Freq: Every day | ORAL | 11 refills | Status: DC

## 2021-09-13 MED ORDER — LIDOCAINE VISCOUS HCL 2 % MT SOLN
15.0000 mL | Freq: Once | OROMUCOSAL | Status: AC
  Administered 2021-09-13: 15 mL via ORAL
  Filled 2021-09-13: qty 15

## 2021-09-13 MED ORDER — ALUM & MAG HYDROXIDE-SIMETH 200-200-20 MG/5ML PO SUSP
30.0000 mL | Freq: Once | ORAL | Status: AC
  Administered 2021-09-13: 30 mL via ORAL
  Filled 2021-09-13: qty 30

## 2021-09-13 NOTE — ED Triage Notes (Signed)
Pt presents to ER c/o GERD that started yesterday.  Pt states he has meds that he takes at home but has run out of them.  Pt states he has had some emesis.  Pt states pain stays in his substernal region and does not radiate.  Pt A&O x4 at this time.

## 2021-09-13 NOTE — ED Provider Notes (Signed)
[facility] Emergency Department Provider Note  ____________________________________________  Time seen: Approximately 10:19 PM  I have reviewed the triage vital signs and the nursing notes.   HISTORY  Chief Complaint Gastroesophageal Reflux    HPI Darryl Flynn is a 40 y.o. male who presents the emergency department for multiple complaints.  Patient is having GERD symptoms and states that he has been out of his prescribed reflux medication for 2 to 3 weeks.  Has had a burning sensation with frequent belching.  He did have 1 episode of emesis today but is not experiencing any sharp abdominal pain.  No chest pain at this time.  He states he has a burning/acid sensation in his throat.  Patient is also complaining of some numbness and tingling of his neck that has been ongoing for several months.  He states that he believes it is a pinched nerve in his neck as this is happened in the past.  He would like some medications to address this issue.  Patient would also like to be seen for a mass on his right back.  He states this is been slowly growing for some time but is getting to the point where he leans up against a hard surface he can feel it pushing into his back.       Past Medical History:  Diagnosis Date   A-fib [facility])    Back ache    Back pain     Patient Active Problem List   Diagnosis Date Noted   Atrial fibrillation (HCC) 04/25/2020   Hiatal hernia with GERD 04/25/2020    History reviewed. No pertinent surgical history.  Prior to Admission medications   Medication Sig Start Date End Date Taking? Authorizing Provider  omeprazole (PRILOSEC) 10 MG capsule Take 1 capsule (10 mg total) by mouth daily. 09/13/21  Yes Arra Connaughton, Delorise Royals, PA-C  predniSONE (DELTASONE) 10 MG tablet Take 1 tablet (10 mg total) by mouth as directed. 09/13/21  Yes Keante Urizar, Delorise Royals, PA-C  albuterol (VENTOLIN HFA) 108 (90 Base) MCG/ACT inhaler Inhale 2 puffs into the lungs  every 6 (six) hours as needed for wheezing or shortness of breath for up to 7 days. 07/11/21 07/24/21  Shaune Pollack, MD  clindamycin (CLEOCIN) 300 MG capsule Take 1 capsule (300 mg total) by mouth 3 (three) times daily. 11/08/16   Sharyn Creamer, MD  cyclobenzaprine (FLEXERIL) 5 MG tablet Take 1 tablet (5 mg total) by mouth 3 (three) times daily as needed for muscle spasms. 07/22/21   Minna Antis, MD  famotidine (PEPCID) 20 MG tablet Take 1 tablet (20 mg total) by mouth 2 (two) times daily. 05/24/20   Irean Hong, MD  HYDROcodone-acetaminophen (NORCO) 5-325 MG tablet Take 1 tablet by mouth every 6 (six) hours as needed for moderate pain. 05/24/20   Irean Hong, MD  ibuprofen (ADVIL,MOTRIN) 600 MG tablet Take 1 tablet (600 mg total) by mouth every 6 (six) hours as needed. 11/08/16   Sharyn Creamer, MD  metoprolol succinate (TOPROL XL) 25 MG 24 hr tablet Take 1 tablet (25 mg total) by mouth daily. 04/11/21   Debbe Odea, MD  pantoprazole (PROTONIX) 40 MG tablet Take 1 tablet (40 mg total) by mouth daily. 07/11/21 08/16/21  Shaune Pollack, MD  sucralfate (CARAFATE) 1 g tablet Take 1 tablet (1 g total) by mouth 4 (four) times daily. 07/11/21 08/16/21  Shaune Pollack, MD    Allergies Citrus  History reviewed. No pertinent family history.  Social History Social History  Tobacco Use   Smoking status: Some Days    Packs/day: 0.50    Types: Cigarettes   Smokeless tobacco: Never  Vaping Use   Vaping Use: Never used  Substance Use Topics   Alcohol use: Yes    Comment: twice a week   Drug use: Yes    Types: Marijuana     Review of Systems  Constitutional: No fever/chills Eyes: No visual changes. No discharge ENT: No upper respiratory complaints. Cardiovascular: no chest pain. Respiratory: no cough. No SOB. Gastrointestinal: Positive for GERD symptoms with belching, acid sensation in his throat.  No abdominal pain.  No nausea, no vomiting.  No diarrhea.  No  constipation. Musculoskeletal: Positive for numbness and tingling running out of the left neck consistent with his pinched nerve. Skin: Negative for rash, abrasions, lacerations, ecchymosis.  Positive for mass to the right ribs Neurological: Negative for headaches, focal weakness or numbness.  10 System ROS otherwise negative.  ____________________________________________   PHYSICAL EXAM:  VITAL SIGNS: ED Triage Vitals  Enc Vitals Group     BP 09/13/21 2053 (!) 129/92     Pulse Rate 09/13/21 2053 79     Resp 09/13/21 2053 16     Temp 09/13/21 2053 98.6 F (37 C)     Temp Source 09/13/21 2053 Oral     SpO2 09/13/21 2053 97 %     Weight 09/13/21 2054 145 lb (65.8 kg)     Height 09/13/21 2054 5\' 9"  (1.753 m)     Head Circumference --      Peak Flow --      Pain Score 09/13/21 2053 6     Pain Loc --      Pain Edu? --      Excl. in GC? --      Constitutional: Alert and oriented. Well appearing and in no acute distress. Eyes: Conjunctivae are normal. PERRL. EOMI. Head: Atraumatic. ENT:      Ears:       Nose: No congestion/rhinnorhea.      Mouth/Throat: Mucous membranes are moist.  Neck: No stridor.  Left-sided paraspinal cervical spine tenderness to palpation.  This extends along the superior shoulder.  Full range of motion to the shoulder, left arm.  Radial pulses sensation intact distally.  Cardiovascular: Normal rate, regular rhythm. Normal S1 and S2.  Good peripheral circulation. Respiratory: Normal respiratory effort without tachypnea or retractions. Lungs CTAB. Good air entry to the bases with no decreased or absent breath sounds. Gastrointestinal: Bowel sounds 4 quadrants. Soft and nontender to palpation. No guarding or rigidity. No palpable masses. No distention. No CVA tenderness. Musculoskeletal: Full range of motion to all extremities. No gross deformities appreciated. Neurologic:  Normal speech and language. No gross focal neurologic deficits are appreciated.   Skin:  Skin is warm, dry and intact. No rash noted.  Visualization of the posterior right rib cage reveals an edematous lesion that is well-circumscribed by palpation, mobile, consistent with epidermal cyst Psychiatric: Mood and affect are normal. Speech and behavior are normal. Patient exhibits appropriate insight and judgement.   ____________________________________________   LABS (all labs ordered are listed, but only abnormal results are displayed)  Labs Reviewed - No data to display ____________________________________________  EKG  ED ECG REPORT I, 2054 Refugio Mcconico,  personally viewed and interpreted this ECG.   Date: 09/13/2021  EKG Time: 2054 hrs.  Rate: 72 bpm  Rhythm: unchanged from previous tracings, normal sinus rhythm, largely unchanged from previous EKG on 07/22/2021  Axis:  Normal axis  Intervals:first-degree A-V block   ST&T Change: No ST elevation or depression noted  Normal sinus rhythm.  Patient has what appears to be slightly elongated PR interval at 218 ms resulting in first-degree AV block.  This is been appreciated on previous EKGs.  No other significant changes.  No STEMI. ____________________________________________  RADIOLOGY   No results found.  ____________________________________________    PROCEDURES  Procedure(s) performed:    Procedures    Medications  pantoprazole (PROTONIX) EC tablet 40 mg (has no administration in time range)  alum & mag hydroxide-simeth (MAALOX/MYLANTA) 200-200-20 MG/5ML suspension 30 mL (30 mLs Oral Given 09/13/21 2252)    And  lidocaine (XYLOCAINE) 2 % viscous mouth solution 15 mL (15 mLs Oral Given 09/13/21 2252)     ____________________________________________   INITIAL IMPRESSION / ASSESSMENT AND PLAN / ED COURSE  Pertinent labs & imaging results that were available during my care of the patient were reviewed by me and considered in my medical decision making (see chart for details).  Review of  the  [place] was performed in accordance of the [facility] prior to dispensing any controlled drugs.           Patient's diagnosis is consistent with patient is here for GERD symptoms after running out of his medication 2 to 3 weeks ago.  Patient like a refill.  No concerning GI symptoms are reported to warrant imaging or labs at this time.  Given the acid sensation in his throat with symptoms EKG was performed which is reassuring.  Patient also was complaining of some cervical radiculopathy symptoms from a known pinched nerve in his neck as well as wanted evaluation of a mass on his back.  Patient will be treated with prednisone after he resolves his GI symptoms.  I have advised patient that he can have some gastritis from prednisone and again I would control his GERD symptoms prior to starting the prednisone.  Patient also has findings consistent with an epidermal cyst to the right back.  He may follow-up with surgery for excision if this becomes bothersome.  Return precautions discussed with the patient.  Patient is stable for discharge at this time..  Patient is given ED precautions to return to the ED for any worsening or new symptoms.     ____________________________________________  FINAL CLINICAL IMPRESSION(S) / ED DIAGNOSES  Final diagnoses:  Gastroesophageal reflux disease, unspecified whether esophagitis present  Cervical radiculopathy  Epidermal cyst      NEW MEDICATIONS STARTED DURING THIS VISIT:  ED Discharge Orders          Ordered    omeprazole (PRILOSEC) 10 MG capsule  Daily        09/13/21 2245    predniSONE (DELTASONE) 10 MG tablet  As directed       Note to Pharmacy: Take on a pattern of 6, 6, 5, 5, 4, 4, 3, 3, 2, 2, 1, 1   09/13/21 2245                This chart was dictated using voice recognition software/Dragon. Despite best efforts to proofread, errors can occur which can change the meaning. Any change was purely unintentional.    Racheal Patches, PA-C 09/13/21 2259    Sharman Cheek, MD 09/14/21 [phone]

## 2021-09-18 ENCOUNTER — Other Ambulatory Visit: Payer: Self-pay

## 2021-09-18 MED ORDER — PREDNISONE 10 MG PO TABS
10.0000 mg | ORAL_TABLET | Freq: Every day | ORAL | 0 refills | Status: DC
  Filled 2021-09-18: qty 42, 12d supply, fill #0

## 2021-09-18 MED ORDER — OMEPRAZOLE 10 MG PO CPDR
10.0000 mg | DELAYED_RELEASE_CAPSULE | Freq: Every day | ORAL | 11 refills | Status: DC
  Filled 2021-09-18: qty 30, 30d supply, fill #0

## 2021-09-19 ENCOUNTER — Other Ambulatory Visit: Payer: Self-pay

## 2021-11-04 ENCOUNTER — Emergency Department: Payer: Self-pay

## 2021-11-04 ENCOUNTER — Other Ambulatory Visit: Payer: Self-pay

## 2021-11-04 ENCOUNTER — Emergency Department
Admission: EM | Admit: 2021-11-04 | Discharge: 2021-11-04 | Disposition: A | Discharge status: Home / Self Care | Payer: Self-pay | Attending: Student in an Organized Health Care Education/Training Program | Admitting: Student in an Organized Health Care Education/Training Program

## 2021-11-04 DIAGNOSIS — M79645 Pain in left finger(s): Secondary | ICD-10-CM | POA: Insufficient documentation

## 2021-11-04 DIAGNOSIS — R051 Acute cough: Secondary | ICD-10-CM | POA: Insufficient documentation

## 2021-11-04 DIAGNOSIS — R002 Palpitations: Secondary | ICD-10-CM | POA: Insufficient documentation

## 2021-11-04 DIAGNOSIS — J449 Chronic obstructive pulmonary disease, unspecified: Secondary | ICD-10-CM | POA: Insufficient documentation

## 2021-11-04 DIAGNOSIS — Z20822 Contact with and (suspected) exposure to covid-19: Secondary | ICD-10-CM | POA: Insufficient documentation

## 2021-11-04 DIAGNOSIS — F17210 Nicotine dependence, cigarettes, uncomplicated: Secondary | ICD-10-CM | POA: Insufficient documentation

## 2021-11-04 DIAGNOSIS — R0981 Nasal congestion: Secondary | ICD-10-CM | POA: Insufficient documentation

## 2021-11-04 DIAGNOSIS — R079 Chest pain, unspecified: Secondary | ICD-10-CM | POA: Insufficient documentation

## 2021-11-04 LAB — BASIC METABOLIC PANEL
Anion gap: 7 (ref 5–15)
BUN: 15 mg/dL (ref 6–20)
CO2: 26 mmol/L (ref 22–32)
Calcium: 8.8 mg/dL — ABNORMAL LOW (ref 8.9–10.3)
Chloride: 106 mmol/L (ref 98–111)
Creatinine, Ser: 1.14 mg/dL (ref 0.61–1.24)
GFR, Estimated: >60 mL/min (ref >60)
Glucose, Bld: 158 mg/dL — ABNORMAL HIGH (ref 70–99)
Potassium: 3.2 mmol/L — ABNORMAL LOW (ref 3.5–5.1)
Sodium: 139 mmol/L (ref 135–145)

## 2021-11-04 LAB — CBC
HCT: 40.4 % (ref 39.0–52.0)
Hemoglobin: 13.8 g/dL (ref 13.0–17.0)
MCH: 31.1 pg (ref 26.0–34.0)
MCHC: 34.2 g/dL (ref 30.0–36.0)
MCV: 91 fL (ref 80.0–100.0)
Platelets: 278 10*3/uL (ref 150–400)
RBC: 4.44 MIL/uL (ref 4.22–5.81)
RDW: 12.8 % (ref 11.5–15.5)
WBC: 5.3 10*3/uL (ref 4.0–10.5)
nRBC: 0 % (ref 0.0–0.2)

## 2021-11-04 LAB — RESP PANEL BY RT-PCR (FLU A&B, COVID) ARPGX2
Influenza A by PCR: NEGATIVE
Influenza B by PCR: NEGATIVE
SARS Coronavirus 2 by RT PCR: NEGATIVE

## 2021-11-04 LAB — TROPONIN I (HIGH SENSITIVITY): Troponin I (High Sensitivity): 4 ng/L (ref <18)

## 2021-11-04 MED ORDER — PREDNISONE 20 MG PO TABS
40.0000 mg | ORAL_TABLET | Freq: Every day | ORAL | 0 refills | Status: DC
  Filled 2021-11-04: qty 10, 5d supply, fill #0

## 2021-11-04 MED ORDER — OMEPRAZOLE 10 MG PO CPDR
10.0000 mg | DELAYED_RELEASE_CAPSULE | Freq: Every day | ORAL | 11 refills | Status: DC
  Filled 2021-11-04: qty 30, 30d supply, fill #0

## 2021-11-04 MED ORDER — ALBUTEROL SULFATE HFA 108 (90 BASE) MCG/ACT IN AERS
2.0000 | INHALATION_SPRAY | Freq: Four times a day (QID) | RESPIRATORY_TRACT | 0 refills | Status: AC | PRN
  Filled 2021-11-04: qty 6.7, 25d supply, fill #0

## 2021-11-04 MED ORDER — IPRATROPIUM-ALBUTEROL 0.5-2.5 (3) MG/3ML IN SOLN
3.0000 mL | Freq: Once | RESPIRATORY_TRACT | Status: DC
  Filled 2021-11-04: qty 3

## 2021-11-04 MED ORDER — METOPROLOL SUCCINATE ER 25 MG PO TB24
25.0000 mg | ORAL_TABLET | Freq: Every day | ORAL | 3 refills | Status: AC
  Filled 2021-11-04: qty 30, 30d supply, fill #0

## 2021-11-04 MED ORDER — CEPHALEXIN 500 MG PO CAPS
500.0000 mg | ORAL_CAPSULE | Freq: Three times a day (TID) | ORAL | 0 refills | Status: AC
  Filled 2021-11-04: qty 21, 7d supply, fill #0

## 2021-11-04 NOTE — ED Provider Notes (Signed)
[facility] Emergency Department Provider Note    Event Date/Time   First MD Initiated Contact with Patient 11/04/21 1023     (approximate)  I have reviewed the triage vital signs and the nursing notes.   HISTORY  Chief Complaint Chest Pain and Palpitations    HPI Darryl Flynn is a 40 y.o. male past medical history of atrial fibrillation not currently taking his medications presents to the ER for episodes of palpitations that occurred yesterday.  Just feels like his heart would intermittently be fluttering while he is walking denies any pain or pressure also having some intermittent cough and some congestion he does smoke has a history of COPD.  Does not feel short of breath at this time.  Also wants to have his left pinky finger evaluated as he is having some mild pain over the volar aspect of the PIP of the left hand.  Thinks he may have injured it while playing with his kids.  Past Medical History:  Diagnosis Date   A-fib (HCC)    Back ache    Back pain    No family history on file. History reviewed. No pertinent surgical history. Patient Active Problem List   Diagnosis Date Noted   Atrial fibrillation (HCC) 04/25/2020   Hiatal hernia with GERD 04/25/2020      Prior to Admission medications   Medication Sig Start Date End Date Taking? Authorizing Provider  cephALEXin (KEFLEX) 500 MG capsule Take 1 capsule (500 mg total) by mouth 3 (three) times daily for 7 days. 11/04/21 11/11/21 Yes Willy Eddy, MD  albuterol (VENTOLIN HFA) 108 (90 Base) MCG/ACT inhaler Inhale 2 puffs into the lungs every 6 (six) hours as needed for wheezing or shortness of breath for up to 7 days. 11/04/21 11/11/21  Willy Eddy, MD  clindamycin (CLEOCIN) 300 MG capsule Take 1 capsule (300 mg total) by mouth 3 (three) times daily. 11/08/16   Sharyn Creamer, MD  cyclobenzaprine (FLEXERIL) 5 MG tablet Take 1 tablet (5 mg total) by mouth 3 (three) times daily as needed  for muscle spasms. 07/22/21   Minna Antis, MD  famotidine (PEPCID) 20 MG tablet Take 1 tablet (20 mg total) by mouth 2 (two) times daily. 05/24/20   Irean Hong, MD  HYDROcodone-acetaminophen (NORCO) 5-325 MG tablet Take 1 tablet by mouth every 6 (six) hours as needed for moderate pain. 05/24/20   Irean Hong, MD  ibuprofen (ADVIL,MOTRIN) 600 MG tablet Take 1 tablet (600 mg total) by mouth every 6 (six) hours as needed. 11/08/16   Sharyn Creamer, MD  metoprolol succinate (TOPROL XL) 25 MG 24 hr tablet Take 1 tablet (25 mg total) by once daily mouth daily. 11/04/21   Willy Eddy, MD  omeprazole (PRILOSEC) 10 MG capsule Take 1 capsule (10 mg total) by mouth daily. 09/13/21   Cuthriell, Delorise Royals, PA-C  omeprazole (PRILOSEC) 10 MG capsule Take 1 capsule (10 mg total) by mouth once daily. 11/04/21   Willy Eddy, MD  pantoprazole (PROTONIX) 40 MG tablet Take 1 tablet (40 mg total) by mouth daily. 07/11/21 08/16/21  Shaune Pollack, MD  sucralfate (CARAFATE) 1 g tablet Take 1 tablet (1 g total) by mouth 4 (four) times daily. 07/11/21 08/16/21  Shaune Pollack, MD    Allergies Citrus    Social History Social History   Tobacco Use   Smoking status: Some Days    Packs/day: 0.50    Types: Cigarettes   Smokeless tobacco: Never  Vaping Use  Vaping Use: Never used  Substance Use Topics   Alcohol use: Yes    Comment: twice a week   Drug use: Yes    Types: Marijuana    Review of Systems Patient denies headaches, rhinorrhea, blurry vision, numbness, shortness of breath, chest pain, edema, cough, abdominal pain, nausea, vomiting, diarrhea, dysuria, fevers, rashes or hallucinations unless otherwise stated above in HPI. ____________________________________________   PHYSICAL EXAM:  VITAL SIGNS: Vitals:   11/04/21 0934  BP: 109/80  Pulse: 94  Resp: 17  Temp: 98.9 F (37.2 C)  SpO2: 96%    Constitutional: Alert and oriented.  Eyes: Conjunctivae are normal.  Head:  Atraumatic. Nose: No congestion/rhinnorhea. Mouth/Throat: Mucous membranes are moist.   Neck: No stridor. Painless ROM.  Cardiovascular: Normal rate, regular rhythm. Grossly normal heart sounds.  Good peripheral circulation. Respiratory: Normal respiratory effort.  No retractions. Lungs CTAB. Gastrointestinal: Soft and nontender. No distention. No abdominal bruits. No CVA tenderness. Genitourinary:  Musculoskeletal: N small mount of erythema over the medial PIP able to passively and actively range the joint there is no fusiform swelling or pain along the flexor or extensor tendons.  No lower extremity tenderness nor edema.  No joint effusions. Neurologic:  Normal speech and language. No gross focal neurologic deficits are appreciated. No facial droop Skin:  Skin is warm, dry and intact. No rash noted. Psychiatric: Mood and affect are normal. Speech and behavior are normal.  ____________________________________________   LABS (all labs ordered are listed, but only abnormal results are displayed)  Results for orders placed or performed during the hospital encounter of 11/04/21 (from the past 24 hour(s))  Basic metabolic panel     Status: Abnormal   Collection Time: 11/04/21  9:20 AM  Result Value Ref Range   Sodium 139 135 - 145 mmol/L   Potassium 3.2 (L) 3.5 - 5.1 mmol/L   Chloride 106 98 - 111 mmol/L   CO2 26 22 - 32 mmol/L   Glucose, Bld 158 (H) 70 - 99 mg/dL   BUN 15 6 - 20 mg/dL   Creatinine, Ser 9.48 0.61 - 1.24 mg/dL   Calcium 8.8 (L) 8.9 - 10.3 mg/dL   GFR, Estimated >01 >65 mL/min   Anion gap 7 5 - 15  CBC     Status: None   Collection Time: 11/04/21  9:20 AM  Result Value Ref Range   WBC 5.3 4.0 - 10.5 K/uL   RBC 4.44 4.22 - 5.81 MIL/uL   Hemoglobin 13.8 13.0 - 17.0 g/dL   HCT 53.7 48.2 - 70.7 %   MCV 91.0 80.0 - 100.0 fL   MCH 31.1 26.0 - 34.0 pg   MCHC 34.2 30.0 - 36.0 g/dL   RDW 86.7 54.4 - 92.0 %   Platelets 278 150 - 400 K/uL   nRBC 0.0 0.0 - 0.2 %  Troponin  I (High Sensitivity)     Status: None   Collection Time: 11/04/21  9:20 AM  Result Value Ref Range   Troponin I (High Sensitivity) 4 <18 ng/L   ____________________________________________  EKG My review and personal interpretation at Time: 9:27   Indication: palpitations  Rate: 80  Rhythm: sinus Axis: normal Other: normal intervals, no stemi ____________________________________________  RADIOLOGY  I personally reviewed all radiographic images ordered to evaluate for the above acute complaints and reviewed radiology reports and findings.  These findings were personally discussed with the patient.  Please see medical record for radiology report.  ____________________________________________   PROCEDURES  Procedure(s) performed:  Procedures    Critical Care performed: no ____________________________________________   INITIAL IMPRESSION / ASSESSMENT AND PLAN / ED COURSE  Pertinent labs & imaging results that were available during my care of the patient were reviewed by me and considered in my medical decision making (see chart for details).   DDX: uri, brnochtiis, afib, dehydration, fracture, contusion, ligamentous injury, cellulitis  Darryl Flynn is a 40 y.o. who presents to the ED with symptoms as described above.  Patient very well-appearing in no acute distress.  Describing symptoms of palpitations and has been off of his A. fib medication he is in sinus rhythm right now.  Not consistent with A. fib.  Not consistent with ACS not having any pain or pressure and troponin negative no ischemic changes on EKG no sign of CHF.  Does have some COPD with an occasional cough but no wheezing otherwise not complaining shortness of breath no hypoxia on exam or tachypnea.  He speaking in complete sentences.  Clinical Course as of 11/04/21 1122  Tue Nov 04, 2021  1115 X-ray without evidence of infiltrate and no sign of fracture or dislocation on exam.  I have a lower suspicion for  septic arthritis may have a small amount of cellulitis given the overlying erythema.  Will place on antibiotic.  Patient does otherwise appear stable appropriate for outpatient follow-up. [PR]    Clinical Course User Index [PR] Willy Eddy, MD    The patient was evaluated in Emergency Department today for the symptoms described in the history of present illness. He/she was evaluated in the context of the global COVID-19 pandemic, which necessitated consideration that the patient might be at risk for infection with the SARS-CoV-2 virus that causes COVID-19. Institutional protocols and algorithms that pertain to the evaluation of patients at risk for COVID-19 are in a state of rapid change based on information released by regulatory bodies including the CDC and federal and state organizations. These policies and algorithms were followed during the patient's care in the ED.  As part of my medical decision making, I reviewed the following data within the electronic MEDICAL RECORD NUMBER Nursing notes reviewed and incorporated, Labs reviewed, notes from prior ED visits and [place] Controlled Substance Database   ____________________________________________   FINAL CLINICAL IMPRESSION(S) / ED DIAGNOSES  Final diagnoses:  Acute cough  Finger pain, left      NEW MEDICATIONS STARTED DURING THIS VISIT:  New Prescriptions   CEPHALEXIN (KEFLEX) 500 MG CAPSULE    Take 1 capsule (500 mg total) by mouth 3 (three) times daily for 7 days.     Note:  This document was prepared using Dragon voice recognition software and may include unintentional dictation errors.    Willy Eddy, MD 11/04/21 1122

## 2021-11-04 NOTE — ED Triage Notes (Addendum)
Pt c/o having heart palpitations since yesterday, with a hx of a-fib, states it is not a pain but having periods of fluttering . Pt also c/o swelling and pain to the left 5th finger , unsure of injury. Pt also asking about a covid screening, denies sx

## 2021-11-06 ENCOUNTER — Other Ambulatory Visit: Payer: Self-pay

## 2021-11-17 ENCOUNTER — Other Ambulatory Visit: Payer: Self-pay

## 2021-11-17 ENCOUNTER — Emergency Department: Payer: Self-pay

## 2021-11-17 ENCOUNTER — Emergency Department
Admission: EM | Admit: 2021-11-17 | Discharge: 2021-11-17 | Disposition: A | Discharge status: Home / Self Care | Payer: Self-pay | Attending: Emergency Medicine | Admitting: Emergency Medicine

## 2021-11-17 DIAGNOSIS — M542 Cervicalgia: Secondary | ICD-10-CM | POA: Insufficient documentation

## 2021-11-17 DIAGNOSIS — K55069 Acute infarction of intestine, part and extent unspecified: Secondary | ICD-10-CM | POA: Insufficient documentation

## 2021-11-17 DIAGNOSIS — K219 Gastro-esophageal reflux disease without esophagitis: Secondary | ICD-10-CM | POA: Insufficient documentation

## 2021-11-17 DIAGNOSIS — F17210 Nicotine dependence, cigarettes, uncomplicated: Secondary | ICD-10-CM | POA: Insufficient documentation

## 2021-11-17 LAB — CBC
HCT: 41.7 % (ref 39.0–52.0)
Hemoglobin: 14 g/dL (ref 13.0–17.0)
MCH: 31.8 pg (ref 26.0–34.0)
MCHC: 33.6 g/dL (ref 30.0–36.0)
MCV: 94.8 fL (ref 80.0–100.0)
Platelets: 260 10*3/uL (ref 150–400)
RBC: 4.4 MIL/uL (ref 4.22–5.81)
RDW: 12.7 % (ref 11.5–15.5)
WBC: 7.5 10*3/uL (ref 4.0–10.5)
nRBC: 0 % (ref 0.0–0.2)

## 2021-11-17 LAB — URINALYSIS, ROUTINE W REFLEX MICROSCOPIC
Bilirubin Urine: NEGATIVE
Glucose, UA: NEGATIVE mg/dL
Hgb urine dipstick: NEGATIVE
Ketones, ur: NEGATIVE mg/dL
Leukocytes,Ua: NEGATIVE
Nitrite: NEGATIVE
Protein, ur: NEGATIVE mg/dL
Specific Gravity, Urine: 1.02 (ref 1.005–1.030)
pH: 7 (ref 5.0–8.0)

## 2021-11-17 LAB — COMPREHENSIVE METABOLIC PANEL
ALT: 31 U/L (ref 0–44)
AST: 57 U/L — ABNORMAL HIGH (ref 15–41)
Albumin: 4.1 g/dL (ref 3.5–5.0)
Alkaline Phosphatase: 87 U/L (ref 38–126)
Anion gap: 4 — ABNORMAL LOW (ref 5–15)
BUN: 17 mg/dL (ref 6–20)
CO2: 27 mmol/L (ref 22–32)
Calcium: 9.1 mg/dL (ref 8.9–10.3)
Chloride: 106 mmol/L (ref 98–111)
Creatinine, Ser: 0.89 mg/dL (ref 0.61–1.24)
GFR, Estimated: >60 mL/min (ref >60)
Glucose, Bld: 100 mg/dL — ABNORMAL HIGH (ref 70–99)
Potassium: 4.1 mmol/L (ref 3.5–5.1)
Sodium: 137 mmol/L (ref 135–145)
Total Bilirubin: 0.5 mg/dL (ref 0.3–1.2)
Total Protein: 7.1 g/dL (ref 6.5–8.1)

## 2021-11-17 LAB — LIPASE, BLOOD: Lipase: 56 U/L — ABNORMAL HIGH (ref 11–51)

## 2021-11-17 MED ORDER — KETOROLAC TROMETHAMINE 30 MG/ML IJ SOLN
15.0000 mg | Freq: Once | INTRAMUSCULAR | Status: AC
  Administered 2021-11-17: 11:00:00 15 mg via INTRAMUSCULAR
  Filled 2021-11-17: qty 1

## 2021-11-17 MED ORDER — NAPROXEN 250 MG PO TABS
250.0000 mg | ORAL_TABLET | Freq: Two times a day (BID) | ORAL | 0 refills | Status: AC
  Filled 2021-11-17: qty 20, 10d supply, fill #0

## 2021-11-17 NOTE — ED Triage Notes (Signed)
Pt come with c/o left sided neck pain that radiates down back and some belly pain. Pt states this started yesterday.

## 2021-11-17 NOTE — ED Notes (Signed)
ED Provider at bedside. 

## 2021-11-17 NOTE — ED Notes (Signed)
Pt to CT now. Will medicate when returns.

## 2021-11-17 NOTE — ED Provider Notes (Signed)
[facility]  ____________________________________________   Event Date/Time   First MD Initiated Contact with Patient 11/17/21 1004     (approximate)  I have reviewed the triage vital signs and the nursing notes.   HISTORY  Chief Complaint Abdominal Pain    HPI Darryl Flynn is a 40 y.o. male past medical history of atrial fibrillation, hiatal hernia with GERD who presents with both neck pain and abdominal pain.  Patient's abdominal pain is been going on for the past 2 days.  It is located in the left flank radiating down to the left abdomen.  He denies urinary symptoms.  Has had some diarrhea but denies nausea or vomiting.  He denies fevers or chills.  Pain is constant.  Has never had this before.  Denies history of kidney stones.  Patient also complains of neck pain that started primarily yesterday.  Is located in the left trap area rating down to left arm.  Denies preceding injury.  Patient has been told that it was a nerve pain in the past and has had this in the past.  Not currently taking anything for it.         Past Medical History:  Diagnosis Date   A-fib [facility])    Back ache    Back pain     Patient Active Problem List   Diagnosis Date Noted   Atrial fibrillation ([place]) 04/25/2020   Hiatal hernia with GERD 04/25/2020    History reviewed. No pertinent surgical history.  Prior to Admission medications   Medication Sig Start Date End Date Taking? Authorizing Provider  naproxen (NAPROSYN) 250 MG tablet Take 1 tablet (250 mg total) by mouth 2 (two) times daily with a meal for 10 days. 11/17/21 11/27/21 Yes Rada Hay, MD  albuterol (VENTOLIN HFA) 108 (90 Base) MCG/ACT inhaler Inhale 2 puffs into the lungs every 6 (six) hours as needed for wheezing or shortness of breath for up to 7 days. 11/04/21 11/12/21  Merlyn Lot, MD  clindamycin (CLEOCIN) 300 MG capsule Take 1 capsule (300 mg total) by mouth 3 (three) times daily. 11/08/16    Delman Kitten, MD  cyclobenzaprine (FLEXERIL) 5 MG tablet Take 1 tablet (5 mg total) by mouth 3 (three) times daily as needed for muscle spasms. 07/22/21   Harvest Dark, MD  famotidine (PEPCID) 20 MG tablet Take 1 tablet (20 mg total) by mouth 2 (two) times daily. 05/24/20   Paulette Blanch, MD  HYDROcodone-acetaminophen (NORCO) 5-325 MG tablet Take 1 tablet by mouth every 6 (six) hours as needed for moderate pain. 05/24/20   Paulette Blanch, MD  ibuprofen (ADVIL,MOTRIN) 600 MG tablet Take 1 tablet (600 mg total) by mouth every 6 (six) hours as needed. 11/08/16   Delman Kitten, MD  metoprolol succinate (TOPROL XL) 25 MG 24 hr tablet Take 1 tablet (25 mg total) by mouth once daily. 11/04/21   Merlyn Lot, MD  omeprazole (PRILOSEC) 10 MG capsule Take 1 capsule (10 mg total) by mouth daily. 09/13/21   Cuthriell, Charline Bills, PA-C  omeprazole (PRILOSEC) 10 MG capsule Take 1 capsule (10 mg total) by mouth once daily. 11/04/21   Merlyn Lot, MD  pantoprazole (PROTONIX) 40 MG tablet Take 1 tablet (40 mg total) by mouth daily. 07/11/21 08/16/21  Duffy Bruce, MD  sucralfate (CARAFATE) 1 g tablet Take 1 tablet (1 g total) by mouth 4 (four) times daily. 07/11/21 08/16/21  Duffy Bruce, MD    Allergies Citrus  No family history  on file.  Social History Social History   Tobacco Use   Smoking status: Some Days    Packs/day: 0.50    Types: Cigarettes   Smokeless tobacco: Never  Vaping Use   Vaping Use: Never used  Substance Use Topics   Alcohol use: Yes    Comment: twice a week   Drug use: Yes    Types: Marijuana    Review of Systems   Review of Systems  Constitutional:  Positive for appetite change. Negative for chills and fever.  Respiratory:  Negative for shortness of breath.   Gastrointestinal:  Positive for abdominal pain and diarrhea. Negative for nausea and vomiting.  Genitourinary:  Positive for hematuria. Negative for dysuria.  Musculoskeletal:  Positive for myalgias and neck  pain.  Neurological:  Negative for weakness and numbness.  All other systems reviewed and are negative.  Physical Exam Updated Vital Signs BP (!) 123/93 (BP Location: Left Arm)   Pulse 70   Temp 98.7 F (37.1 C) (Oral)   Resp 18   SpO2 100%   Physical Exam Vitals and nursing note reviewed.  Constitutional:      General: He is not in acute distress.    Appearance: Normal appearance.  HENT:     Head: Normocephalic and atraumatic.  Eyes:     General: No scleral icterus.    Conjunctiva/sclera: Conjunctivae normal.  Pulmonary:     Effort: Pulmonary effort is normal. No respiratory distress.     Breath sounds: Normal breath sounds. No wheezing.  Abdominal:     General: Abdomen is flat. There is no distension.     Tenderness: There is abdominal tenderness. There is no right CVA tenderness or left CVA tenderness.     Comments: Mild tenderness to palpation the left lower quadrant  Musculoskeletal:        General: No deformity or signs of injury.     Cervical back: Normal range of motion.  Skin:    Coloration: Skin is not jaundiced or pale.  Neurological:     General: No focal deficit present.     Mental Status: He is alert and oriented to person, place, and time. Mental status is at baseline.  Psychiatric:        Mood and Affect: Mood normal.        Behavior: Behavior normal.     LABS (all labs ordered are listed, but only abnormal results are displayed)  Labs Reviewed  LIPASE, BLOOD - Abnormal; Notable for the following components:      Result Value   Lipase 56 (*)    All other components within normal limits  COMPREHENSIVE METABOLIC PANEL - Abnormal; Notable for the following components:   Glucose, Bld 100 (*)    AST 57 (*)    Anion gap 4 (*)    All other components within normal limits  CBC  URINALYSIS, ROUTINE W REFLEX MICROSCOPIC   ____________________________________________  EKG  N/a ____________________________________________  RADIOLOGY Almeta Monas, personally viewed and evaluated these images (plain radiographs) as part of my medical decision making, as well as reviewing the written report by the radiologist.  ED MD interpretation: I reviewed the CT abdomen and pelvis without contrast which shows a potential omental infarct    ____________________________________________   PROCEDURES  Procedure(s) performed (including Critical Care):  Procedures   ____________________________________________   INITIAL IMPRESSION / ASSESSMENT AND PLAN / ED COURSE   40 year old male presenting both with left neck pain and left-sided abdominal pain.  The abdominal pain started 2 days ago was primarily in the left upper and lower quadrant/left flank area without urinary symptoms or other associated symptoms.  The neck pain has been an ongoing issue but is flaring today.  Is locating over the left trap area radiating down to the left arm.  He has no objective weakness on exam.  Did not have any injury.  Suspect cervical radiculopathy versus cervical strain will treat supportively.  His UA did not show any blood but obtain CT renal study to rule out stone or other acute pathology.  Interestingly there is suggestion of a omental infarct in the area of around the spleen which would explain his pain.  No indication for antibiotics or surgical treatment.  Will treat supportively with NSAIDs.  Discussed primary care follow-up with the patient.      ____________________________________________   FINAL CLINICAL IMPRESSION(S) / ED DIAGNOSES  Final diagnoses:  Omental infarction (HCC)  Neck pain     ED Discharge Orders          Ordered    naproxen (NAPROSYN) 250 MG tablet  2 times daily with meals        11/17/21 1135             Note:  This document was prepared using Dragon voice recognition software and may include unintentional dictation errors.    Georga Hacking, MD 11/17/21 [phone]

## 2021-11-17 NOTE — Discharge Instructions (Signed)
Your CAT scan shows that you have an omental infarction which is an injury to the fact that lines your intestines which is likely the cause of your abdominal pain.  This is something that should improve on its own and does not require any specific treatment other than pain control.  You can take Tylenol and naproxen for the pain.

## 2021-12-08 ENCOUNTER — Encounter: Payer: Self-pay | Admitting: Cardiology

## 2022-01-23 ENCOUNTER — Telehealth: Payer: Self-pay | Admitting: Pharmacist

## 2022-01-23 NOTE — Telephone Encounter (Signed)
Patient failed to provide requested 2023 financial documentation. No additional medication assistance will be provided by [facility] without the required proof of income documentation. Patient notified by letter. ? ?Deborah Barnes ?[facility] ?

## 2022-01-26 ENCOUNTER — Other Ambulatory Visit: Payer: Self-pay

## 2022-01-27 ENCOUNTER — Telehealth: Payer: Self-pay | Admitting: Pharmacy Technician

## 2022-01-27 NOTE — Telephone Encounter (Signed)
°  Darryl Flynn, [place]  [address]  January 23, 2022    Dear Darryl Flynn:  This is to inform you that you are no longer eligible to receive medication assistance at [facility].  The reason(s) are:    _____Your total gross monthly household income exceeds 300% of the Federal Poverty Level.   _____Tangible assets (savings, checking, stocks/bonds, pension, retirement, etc.) exceeds our limit  _____You are eligible to receive benefits from [facility], [facility] or HIV Medication              Assistance Program _____You are eligible to receive benefits from a Medicare Part D plan _____You have prescription insurance  _____You are not an [facility] resident __X__Failure to provide all requested documentation (proof of income for 2023, and/or Patient Intake Application, DOH Attestation, Contract, etc).    Medication assistance will resume once all requested documentation has been returned to our clinic.  If you have questions, please contact our clinic at [phone].    Thank you,  [facility]

## 2022-02-03 ENCOUNTER — Other Ambulatory Visit: Payer: Self-pay

## 2022-03-30 NOTE — Progress Notes (Signed)
Unable to contact to schedule echocardiogram, letter sent. Order cancelled.  

## 2023-10-25 IMAGING — CR DG FINGER LITTLE 2+V*L*
4 series · 4 of 4 positions shown · non-contrast
Comparison: None.

CLINICAL DATA: Pain

EXAM:
LEFT LITTLE FINGER 2+V

[finger ap]
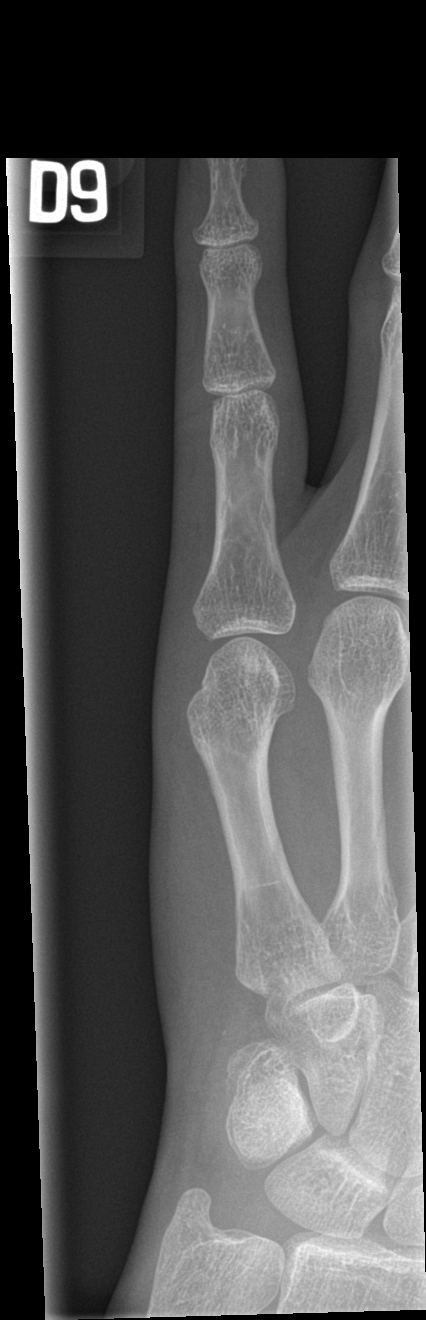

[finger obl]
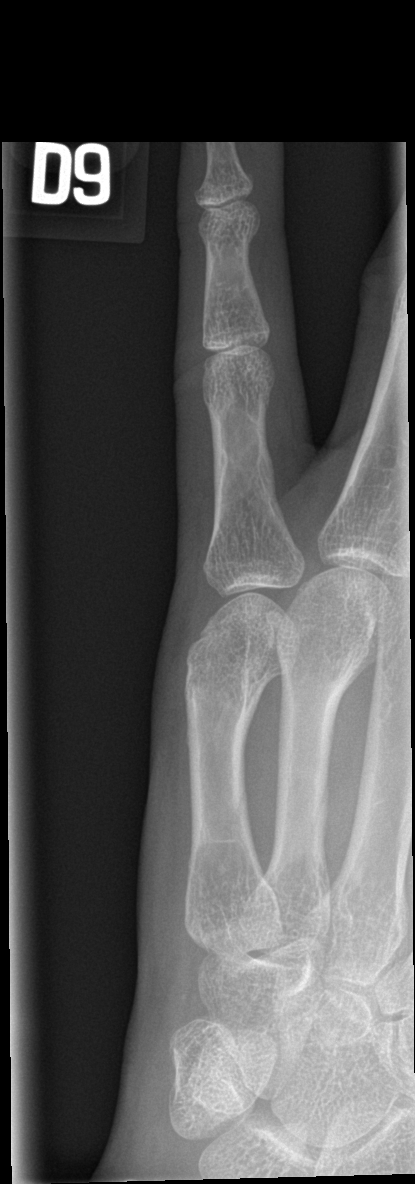

[finger lat (1 of 2)]
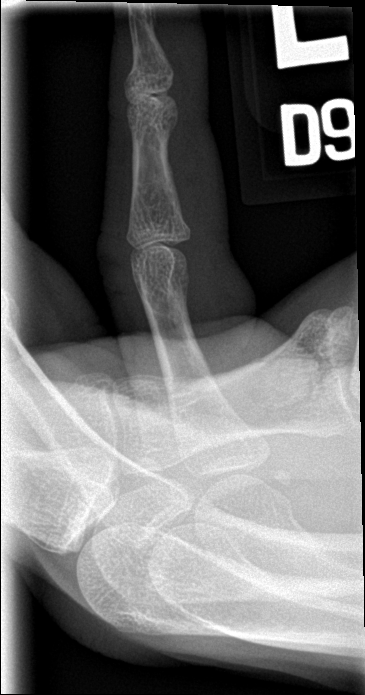

[finger lat (2 of 2)]
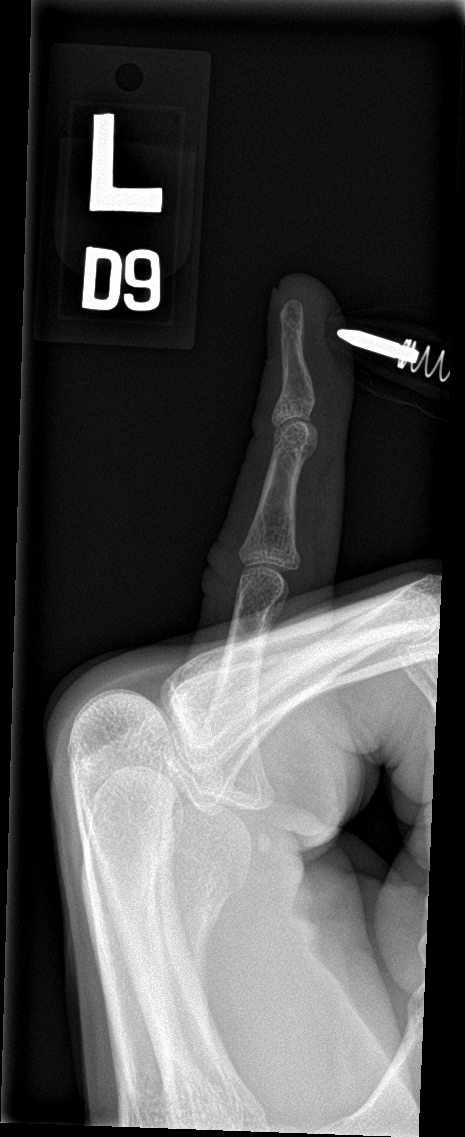

[4 of 4 positions shown; findings below may reference images not displayed]

FINDINGS: There is no evidence of fracture or dislocation. There is no
evidence of arthropathy or other focal bone abnormality. Soft
tissues are unremarkable.
IMPRESSION: Negative.

## 2023-11-07 IMAGING — CT CT RENAL STONE PROTOCOL
3 of 4 series · 10 of 46 positions shown, 17 images · non-contrast
Comparison: None.

CLINICAL DATA: One day of left-sided abdominal/back pain

EXAM:
CT ABDOMEN AND PELVIS WITHOUT CONTRAST
TECHNIQUE: Multidetector CT imaging of the abdomen and pelvis was performed
following the standard protocol without IV contrast.

[Series 4: lung bases · axial · 0.65mm/px · z∈[-216,-116]mm · 6 of 28 slices shown, 11 images]
[im 4/28  soft-tissue]
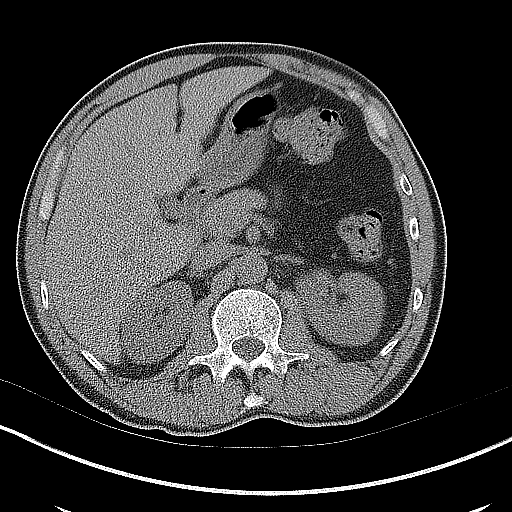
[im 4/28  bone]
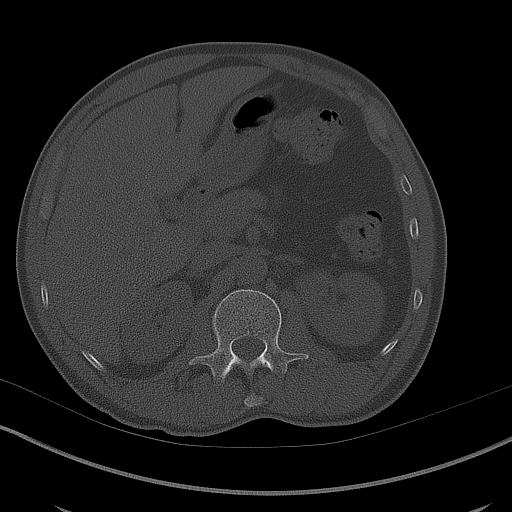
[im 8/28  soft-tissue]
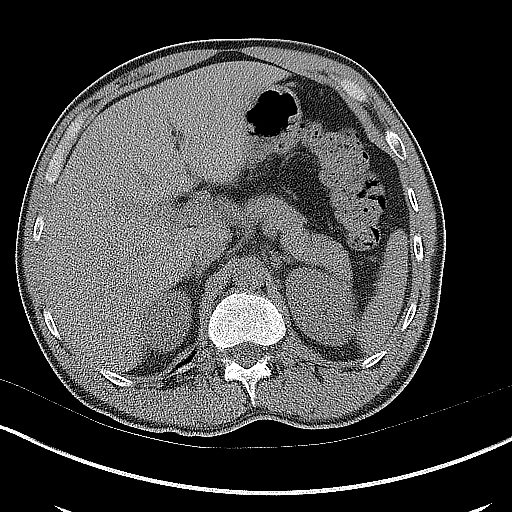
[im 12/28  soft-tissue]
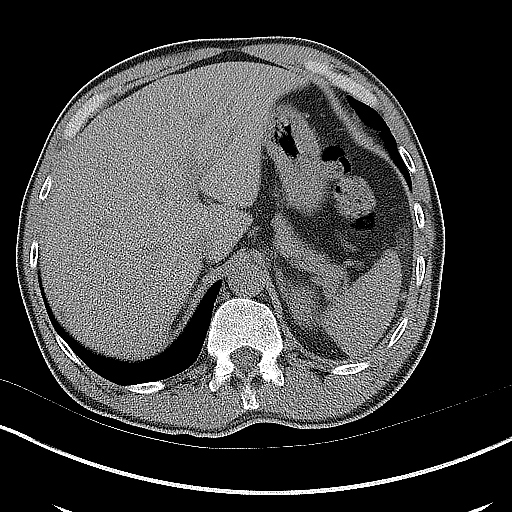
[im 12/28  lung]
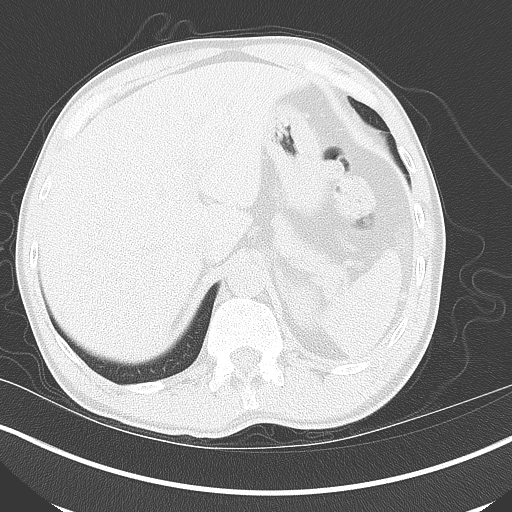
[im 16/28  soft-tissue]
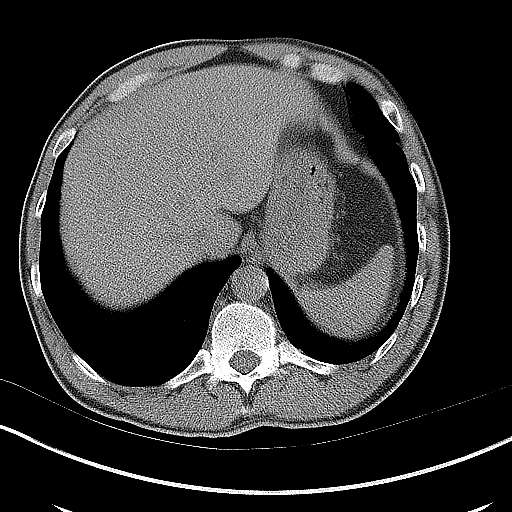
[im 16/28  lung]
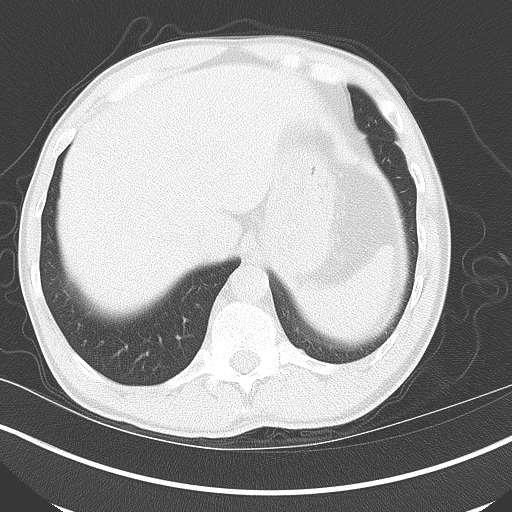
[im 20/28  soft-tissue]
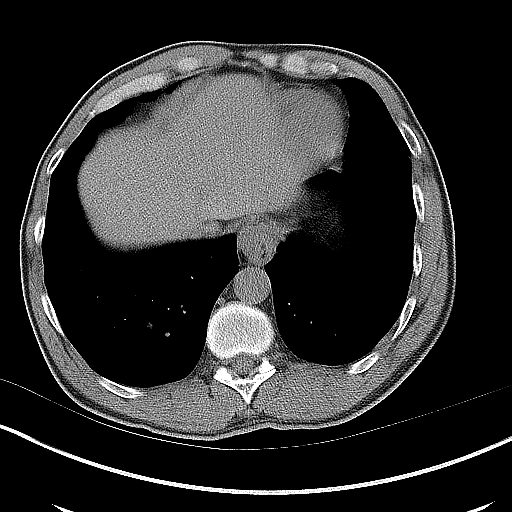
[im 20/28  lung]
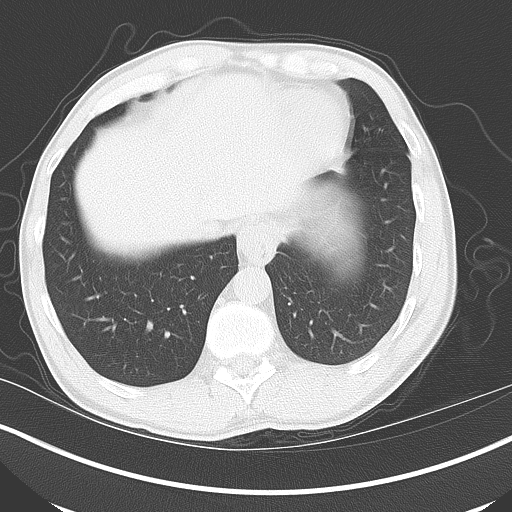
[im 24/28  soft-tissue]
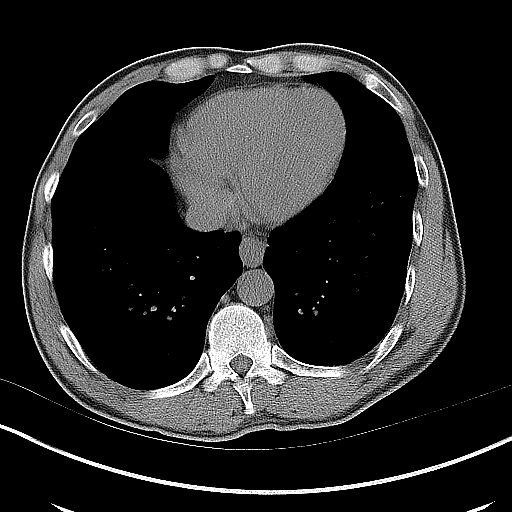
[im 24/28  lung]
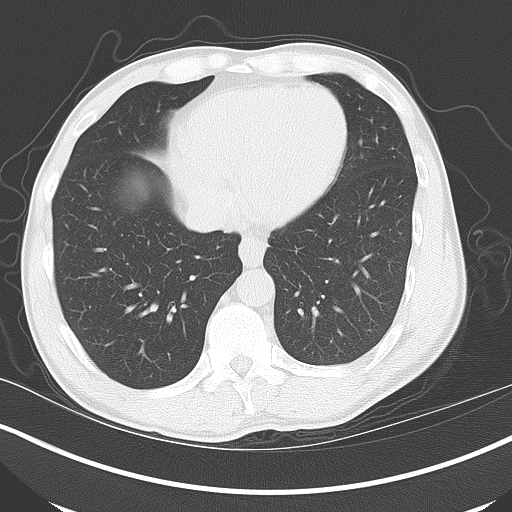

[Series 5: coronal · coronal · 0.75mm/px · 3 of 127 slices shown, 4 images]
[im 43/127  soft-tissue]
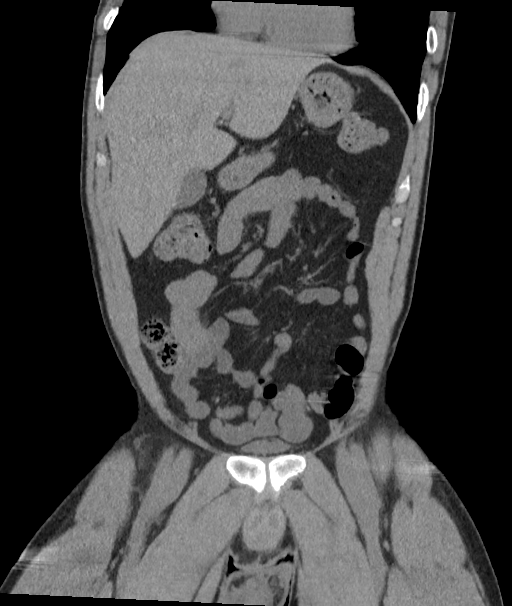
[im 57/127  soft-tissue]
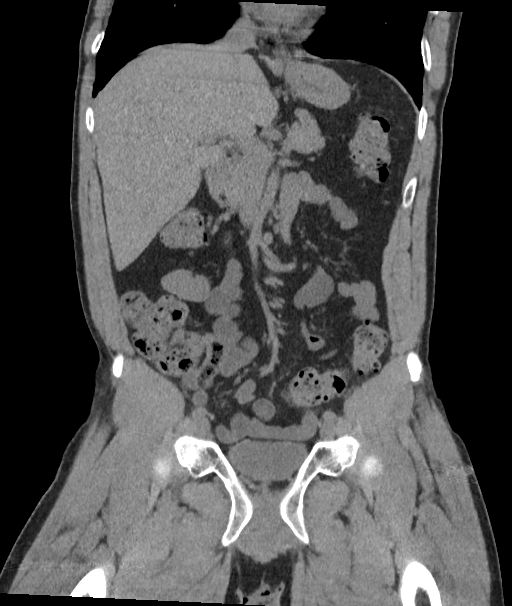
[im 57/127  bone]
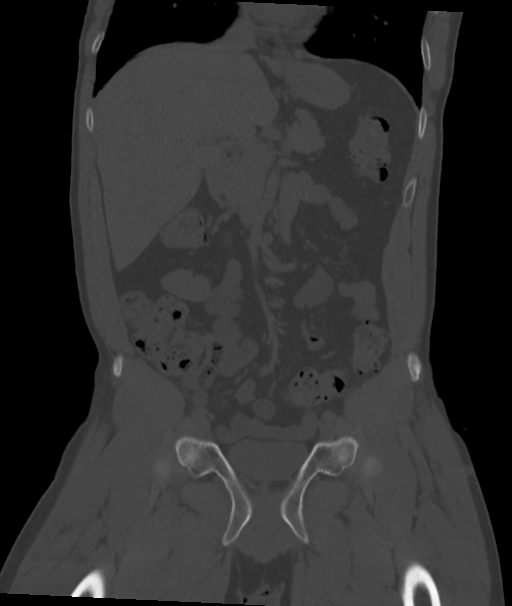
[im 71/127  soft-tissue]
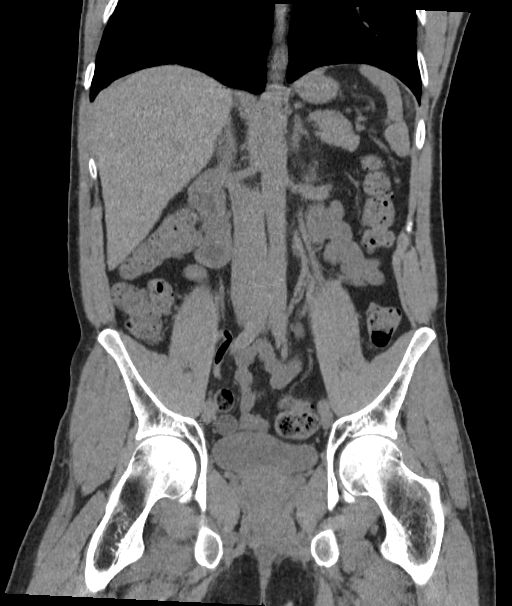

[Series 6: sagittal · sagittal · 0.59mm/px · 1 of 159 slices shown, 2 images]
[im 53/159  soft-tissue]
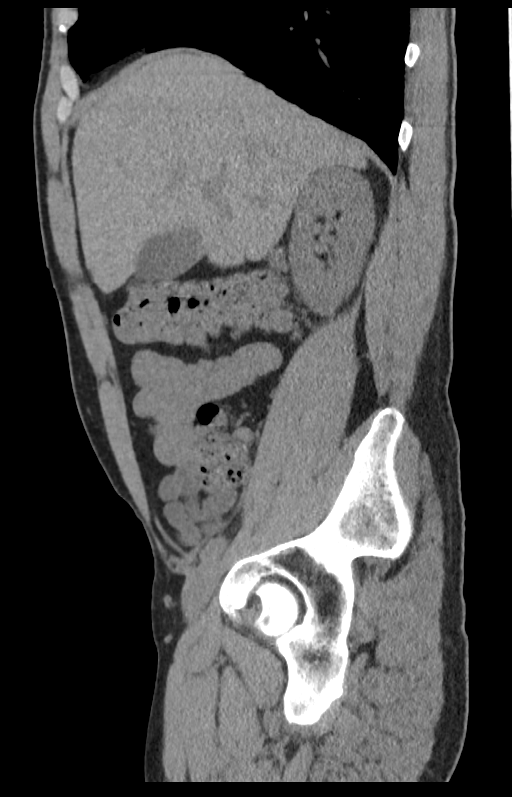
[im 53/159  bone]
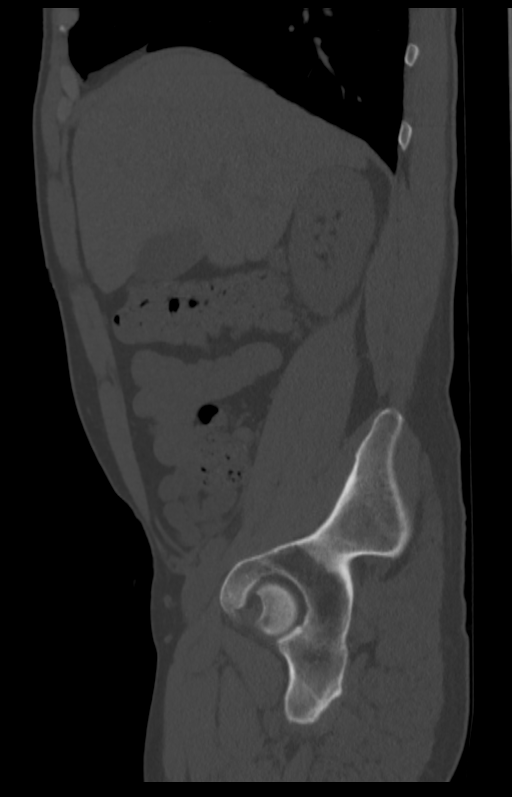

[10 of 46 positions shown; findings below may reference images not displayed]

FINDINGS: Lower chest: No acute abnormality.

Hepatobiliary: Hypodense 1.4 cm fluid density lesion in the right
lobe of the liver likely reflecting hepatic cysts. Gallbladder is
unremarkable. No biliary ductal dilation.

Pancreas: No pancreatic ductal dilation or evidence of acute
inflammation.

Spleen: Within normal limits.

Adrenals/Urinary Tract: Bilateral adrenal glands are unremarkable.
No hydronephrosis. No renal, ureteral or bladder calculi visualized.
Urinary bladder is unremarkable.

Stomach/Bowel: No enteric contrast was administered. Small hiatal
hernia otherwise the stomach is unremarkable for degree of
distension. No pathologic dilation of small or large bowel. The
terminal ileum and appendix appear normal. Colonic diverticulosis
without findings of acute diverticulitis.

Vascular/Lymphatic: Aortic atherosclerosis without abdominal aortic
aneurysm. No pathologically enlarged abdominal or pelvic lymph
nodes.

Reproductive: Prostate is unremarkable.

Other: Inflammation along the superolateral aspect of the spleen
with a central hypodense area seen on image 66/5 and 135/6. No
significant abdominopelvic free fluid. No pneumoperitoneum.

Musculoskeletal: Mild lower lumbar spondylosis. No acute osseous
abnormality.
IMPRESSION: 1. Inflammation along the anterolateral aspect of the spleen with a
central hypodense area, most likely reflecting omental infarction,
which is a benign self-limiting condition the can be a cause of
abdominal pain.
2. Colonic diverticulosis without findings of acute diverticulitis.
3. Small hiatal hernia.
4. Aortic Atherosclerosis ([id number]).

## 2023-12-06 ENCOUNTER — Ambulatory Visit: Payer: Self-pay | Admitting: Nurse Practitioner

## 2023-12-06 DIAGNOSIS — Z202 Contact with and (suspected) exposure to infections with a predominantly sexual mode of transmission: Secondary | ICD-10-CM

## 2023-12-06 MED ORDER — METRONIDAZOLE 500 MG PO TABS: 2000.0000 mg | ORAL_TABLET | Freq: Once | ORAL | Status: AC

## 2023-12-06 NOTE — Progress Notes (Signed)
Pt is here as contact to trich, denies provider exam and additional testing. The patient was dispensed metronidazole #4 today. I provided counseling today regarding the medication. We discussed the medication, the side effects and when to call clinic. Patient given the opportunity to ask questions. Questions answered.  Gaspar Garbe, RN

## 2024-01-23 ENCOUNTER — Emergency Department: Payer: Self-pay

## 2024-01-23 ENCOUNTER — Other Ambulatory Visit: Payer: Self-pay

## 2024-01-23 ENCOUNTER — Emergency Department
Admission: EM | Admit: 2024-01-23 | Discharge: 2024-01-23 | Disposition: A | Discharge status: Home / Self Care | Payer: Self-pay | Attending: Emergency Medicine | Admitting: Emergency Medicine

## 2024-01-23 DIAGNOSIS — S39012A Strain of muscle, fascia and tendon of lower back, initial encounter: Secondary | ICD-10-CM | POA: Insufficient documentation

## 2024-01-23 DIAGNOSIS — X58XXXA Exposure to other specified factors, initial encounter: Secondary | ICD-10-CM | POA: Insufficient documentation

## 2024-01-23 DIAGNOSIS — S3992XA Unspecified injury of lower back, initial encounter: Secondary | ICD-10-CM | POA: Diagnosis present

## 2024-01-23 MED ORDER — NABUMETONE 750 MG PO TABS: 750.0000 mg | ORAL_TABLET | Freq: Two times a day (BID) | ORAL | 0 refills | Status: AC

## 2024-01-23 MED ORDER — CYCLOBENZAPRINE HCL 5 MG PO TABS
5.0000 mg | ORAL_TABLET | Freq: Three times a day (TID) | ORAL | 0 refills | Status: AC | PRN
Start: 2024-01-23 — End: ?

## 2024-01-23 MED ORDER — CYCLOBENZAPRINE HCL 10 MG PO TABS
10.0000 mg | ORAL_TABLET | Freq: Once | ORAL | Status: AC
  Administered 2024-01-23: 10 mg via ORAL
  Filled 2024-01-23: qty 1

## 2024-01-23 MED ORDER — LIDOCAINE 5 % EX PTCH: 1.0000 | MEDICATED_PATCH | Freq: Two times a day (BID) | CUTANEOUS | 0 refills | Status: AC | PRN

## 2024-01-23 MED ORDER — LIDOCAINE 5 % EX PTCH
1.0000 | MEDICATED_PATCH | Freq: Once | CUTANEOUS | Status: DC
  Administered 2024-01-23: 1 via TRANSDERMAL
  Filled 2024-01-23: qty 1

## 2024-01-23 MED ORDER — NAPROXEN 500 MG PO TABS
500.0000 mg | ORAL_TABLET | Freq: Once | ORAL | Status: AC
  Administered 2024-01-23: 500 mg via ORAL
  Filled 2024-01-23: qty 1

## 2024-01-23 NOTE — ED Triage Notes (Addendum)
Pt states back pain that started yesterday, pt denies injury, pt states HX of scoliosis. NAD noted.    Pt denies urinary s/s.

## 2024-01-23 NOTE — ED Provider Notes (Signed)
[facility] Emergency Department Provider Note     Event Date/Time   First MD Initiated Contact with Patient 01/23/24 1053     (approximate)   History   Back Pain   HPI  Darryl Flynn is a 43 y.o. male with chronic intermittent back pain, A-fib and hiatal hernia, presents to the ED endorsing acute on chronic low back pain.  He reports onset yesterday without provocation or injury.  He denies any bladder or bowel incontinence, foot drop, or saddle anesthesia.  Patient denies any current treatment for his chronic back pain.  Physical Exam   Triage Vital Signs: ED Triage Vitals [01/23/24 0833]  Encounter Vitals Group     BP (!) 115/90     Systolic BP Percentile      Diastolic BP Percentile      Pulse Rate 67     Resp 18     Temp 97.9 F (36.6 C)     Temp Source Oral     SpO2 100 %     Weight 160 lb (72.6 kg)     Height 5\' 9"  (1.753 m)     Head Circumference      Peak Flow      Pain Score 9     Pain Loc      Pain Education      Exclude from Growth Chart     Most recent vital signs: Vitals:   01/23/24 0833 01/23/24 1223  BP: (!) 115/90 (!) 129/92  Pulse: 67 (!) 57  Resp: 18 16  Temp: 97.9 F (36.6 C) 98.1 F (36.7 C)  SpO2: 100% 100%    General Awake, no distress. NAD HEENT NCAT. PERRL. EOMI. No rhinorrhea. Mucous membranes are moist.  CV:  Good peripheral perfusion. RRR RESP:  Normal effort. CTA ABD:  No distention.  MSK:  Normal spinal alignment without midline tenderness, spasm, deformity, or step-off.  Normal active range of motion of the lower extremity bilaterally.  Normal hip flexion and extension on exam. NEURO: Cranial nerves II to XII grossly intact.  Normal LE DTRs bilaterally.  Normal toe dorsiflexion and foot eversion on exam.  Negative seated straight leg raise bilaterally.  ED Results / Procedures / Treatments   Labs (all labs ordered are listed, but only abnormal results are displayed) Labs Reviewed - No data  to display   EKG   RADIOLOGY  I personally viewed and evaluated these images as part of my medical decision making, as well as reviewing the written report by the radiologist.  ED Provider Interpretation: No acute findings  DG Lumbar Spine 2-3 Views Result Date: 01/23/2024 CLINICAL DATA:  Low back pain EXAM: LUMBAR SPINE - 2-3 VIEW COMPARISON:  10/16/2013 FINDINGS: Transitional lumbosacral anatomy with partial lumbarization of the S1 segment. Mild disc space narrowing at L4-5 and L5-S1. Vertebral body heights are maintained without fracture. No static listhesis. 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 No acute findings. Electronically Signed   By: Duanne Guess D.O.   On: 01/23/2024 09:08     PROCEDURES:  Critical Care performed: No  Procedures   MEDICATIONS ORDERED IN ED: Medications  lidocaine (LIDODERM) 5 % 1 patch (1 patch Transdermal Patch Applied 01/23/24 1231)  naproxen (NAPROSYN) tablet 500 mg (500 mg Oral Given 01/23/24 1231)  cyclobenzaprine (FLEXERIL) tablet 10 mg (10 mg Oral Given 01/23/24 1231)     IMPRESSION / MDM / ASSESSMENT AND PLAN / ED COURSE  I reviewed the triage  vital signs and the nursing notes.                              Differential diagnosis includes, but is not limited to, lumbar strain, lumbar radiculopathy, DDD  Patient's presentation is most consistent with acute, uncomplicated illness.  Patient's diagnosis is consistent with acute on chronic low back pain.  With reassuring exam and workup at this time.  No red flags on exam.  No evidence of acute spinal cord compression or concern for cauda equina syndrome.  Patient will be discharged home with prescriptions for cyclobenzaprine, Lidoderm patches, and nabumetone. Patient is to follow up with primary pediatrician or [facility] as discussed, as needed or otherwise directed. Patient is given ED precautions to return to the ED for any worsening or new symptoms.  FINAL  CLINICAL IMPRESSION(S) / ED DIAGNOSES   Final diagnoses:  Strain of lumbar region, initial encounter     Rx / DC Orders   ED Discharge Orders          Ordered    cyclobenzaprine (FLEXERIL) 5 MG tablet  3 times daily PRN        01/23/24 1223    nabumetone (RELAFEN) 750 MG tablet  2 times daily        01/23/24 1223    lidocaine (LIDODERM) 5 %  Every 12 hours PRN        01/23/24 1223             Note:  This document was prepared using Dragon voice recognition software and may include unintentional dictation errors.    Lissa Hoard, PA-C 01/23/24 1532    Chesley Noon, MD 01/24/24 2342

## 2024-01-23 NOTE — Discharge Instructions (Signed)
Your exam and x-ray are normal and reassuring at this time.  No signs of a serious fracture or dislocation.  Mild degenerative changes to the spine.  Take description as directed.  Follow-up with [facility] or local urgent care for ongoing concerns.  Consider selecting a family medicine or internal medicine clinic for ongoing medical care.

## 2024-04-30 ENCOUNTER — Emergency Department
Admission: EM | Admit: 2024-04-30 | Discharge: 2024-04-30 | Disposition: A | Discharge status: Home / Self Care | Payer: Self-pay | Attending: Emergency Medicine | Admitting: Emergency Medicine

## 2024-04-30 ENCOUNTER — Emergency Department: Payer: Self-pay

## 2024-04-30 ENCOUNTER — Other Ambulatory Visit: Payer: Self-pay

## 2024-04-30 DIAGNOSIS — N179 Acute kidney failure, unspecified: Secondary | ICD-10-CM | POA: Insufficient documentation

## 2024-04-30 DIAGNOSIS — R1030 Lower abdominal pain, unspecified: Secondary | ICD-10-CM

## 2024-04-30 DIAGNOSIS — K602 Anal fissure, unspecified: Secondary | ICD-10-CM | POA: Insufficient documentation

## 2024-04-30 DIAGNOSIS — E860 Dehydration: Secondary | ICD-10-CM | POA: Insufficient documentation

## 2024-04-30 LAB — COMPREHENSIVE METABOLIC PANEL WITH GFR
ALT: 26 U/L (ref 0–44)
AST: 27 U/L (ref 15–41)
Albumin: 3.6 g/dL (ref 3.5–5.0)
Alkaline Phosphatase: 60 U/L (ref 38–126)
Anion gap: 7 (ref 5–15)
BUN: 18 mg/dL (ref 6–20)
CO2: 24 mmol/L (ref 22–32)
Calcium: 8.6 mg/dL — ABNORMAL LOW (ref 8.9–10.3)
Chloride: 107 mmol/L (ref 98–111)
Creatinine, Ser: 1.76 mg/dL — ABNORMAL HIGH (ref 0.61–1.24)
GFR, Estimated: 48 mL/min — ABNORMAL LOW (ref >60)
Glucose, Bld: 112 mg/dL — ABNORMAL HIGH (ref 70–99)
Potassium: 4.7 mmol/L (ref 3.5–5.1)
Sodium: 138 mmol/L (ref 135–145)
Total Bilirubin: 0.8 mg/dL (ref 0.0–1.2)
Total Protein: 6.1 g/dL — ABNORMAL LOW (ref 6.5–8.1)

## 2024-04-30 LAB — CBC WITH DIFFERENTIAL/PLATELET
Abs Immature Granulocytes: 0 10*3/uL (ref 0.00–0.07)
Basophils Absolute: 0 10*3/uL (ref 0.0–0.1)
Basophils Relative: 1 %
Eosinophils Absolute: 0.2 10*3/uL (ref 0.0–0.5)
Eosinophils Relative: 5 %
HCT: 42.3 % (ref 39.0–52.0)
Hemoglobin: 14.1 g/dL (ref 13.0–17.0)
Immature Granulocytes: 0 %
Lymphocytes Relative: 52 %
Lymphs Abs: 2.2 10*3/uL (ref 0.7–4.0)
MCH: 31.6 pg (ref 26.0–34.0)
MCHC: 33.3 g/dL (ref 30.0–36.0)
MCV: 94.8 fL (ref 80.0–100.0)
Monocytes Absolute: 0.3 10*3/uL (ref 0.1–1.0)
Monocytes Relative: 6 %
Neutro Abs: 1.5 10*3/uL — ABNORMAL LOW (ref 1.7–7.7)
Neutrophils Relative %: 36 %
Platelets: 295 10*3/uL (ref 150–400)
RBC: 4.46 MIL/uL (ref 4.22–5.81)
RDW: 12.7 % (ref 11.5–15.5)
WBC: 4.2 10*3/uL (ref 4.0–10.5)
nRBC: 0 % (ref 0.0–0.2)

## 2024-04-30 LAB — LIPASE, BLOOD: Lipase: 47 U/L (ref 11–51)

## 2024-04-30 MED ORDER — IOHEXOL 300 MG/ML  SOLN
100.0000 mL | Freq: Once | INTRAMUSCULAR | Status: AC | PRN
  Administered 2024-04-30: 100 mL via INTRAVENOUS

## 2024-04-30 MED ORDER — ONDANSETRON HCL 4 MG PO TABS
4.0000 mg | ORAL_TABLET | Freq: Three times a day (TID) | ORAL | 0 refills | Status: AC | PRN
  Filled 2024-04-30: qty 15, 5d supply, fill #0

## 2024-04-30 MED ORDER — HYDROCORTISONE 0.5 % EX CREA
1.0000 | TOPICAL_CREAM | Freq: Two times a day (BID) | CUTANEOUS | 0 refills | Status: AC
  Filled 2024-04-30: qty 56.8, 28d supply, fill #0

## 2024-04-30 MED ORDER — LACTATED RINGERS IV BOLUS
1000.0000 mL | Freq: Once | INTRAVENOUS | Status: AC
  Administered 2024-04-30: 1000 mL via INTRAVENOUS

## 2024-04-30 MED ORDER — ONDANSETRON HCL 4 MG/2ML IJ SOLN
4.0000 mg | Freq: Once | INTRAMUSCULAR | Status: AC
  Administered 2024-04-30: 4 mg via INTRAVENOUS
  Filled 2024-04-30: qty 2

## 2024-04-30 NOTE — Discharge Instructions (Addendum)
 I have sent a referral to the GI doctors for you to have follow-up for possible endoscopy or colonoscopy.  Please call the provider listed in your paperwork to set up this outpatient appointment.  These include sitz bath which are warm salt baths 2-3 times per day as needed.  I have also sent hydrocortisone cream to your pharmacy which you can apply to the area may help as well.  I have also given you documents to help treat a potential anal fissure.

## 2024-04-30 NOTE — ED Provider Notes (Addendum)
 [facility] Provider Note    Event Date/Time   First MD Initiated Contact with Patient 04/30/24 2144     (approximate)   History   Abdominal Pain   HPI Darryl Flynn is a 43 y.o. male presenting today for abdominal pain.  Patient states over the past year he has had intermittent abdominal pain but worsened in his left lower quadrant over the past several days.  Is having nausea with vomiting.  States he has a history of gastritis which often feels similar to this as well.  Taking all his medications as prescribed.  Separately also having pain when he has a bowel movement and occasional burning sensation at the site.  Occasionally wipes and notices bright red blood.  Denies any history of hemorrhoids.     Physical Exam   Triage Vital Signs: ED Triage Vitals  Encounter Vitals Group     BP 04/30/24 2104 (!) 124/92     Systolic BP Percentile --      Diastolic BP Percentile --      Pulse Rate 04/30/24 2104 68     Resp 04/30/24 2104 19     Temp 04/30/24 2104 98.5 F (36.9 C)     Temp Source 04/30/24 2104 Oral     SpO2 04/30/24 2104 100 %     Weight 04/30/24 2105 156 lb (70.8 kg)     Height 04/30/24 2105 5\' 9"  (1.753 m)     Head Circumference --      Peak Flow --      Pain Score 04/30/24 2105 10     Pain Loc --      Pain Education --      Exclude from Growth Chart --     Most recent vital signs: Vitals:   04/30/24 2104  BP: (!) 124/92  Pulse: 68  Resp: 19  Temp: 98.5 F (36.9 C)  SpO2: 100%   Physical Exam: I have reviewed the vital signs and nursing notes. General: Awake, alert, no acute distress.  Nontoxic appearing. Head:  Atraumatic, normocephalic.   ENT:  EOM intact, PERRL. Oral mucosa is pink and moist with no lesions. Neck: Neck is supple with full range of motion, No meningeal signs. Cardiovascular:  RRR, No murmurs. Peripheral pulses palpable and equal bilaterally. Respiratory:  Symmetrical chest wall expansion.  No rhonchi,  rales, or wheezes.  Good air movement throughout.  No use of accessory muscles.   Musculoskeletal:  No cyanosis or edema. Moving extremities with full ROM Abdomen:  Soft, tenderness palpation left lower quadrant, nondistended. Rectal: Questionable anal fissure. No external hemorrhoids Neuro:  GCS 15, moving all four extremities, interacting appropriately. Speech clear. Psych:  Calm, appropriate.   Skin:  Warm, dry, no rash.    ED Results / Procedures / Treatments   Labs (all labs ordered are listed, but only abnormal results are displayed) Labs Reviewed  COMPREHENSIVE METABOLIC PANEL WITH GFR - Abnormal; Notable for the following components:      Result Value   Glucose, Bld 112 (*)    Creatinine, Ser 1.76 (*)    Calcium 8.6 (*)    Total Protein 6.1 (*)    GFR, Estimated 48 (*)    All other components within normal limits  CBC WITH DIFFERENTIAL/PLATELET - Abnormal; Notable for the following components:   Neutro Abs 1.5 (*)    All other components within normal limits  LIPASE, BLOOD     EKG    RADIOLOGY Independently interpreted CT  abdomen/pelvis with no acute pathology   PROCEDURES:  Critical Care performed: No  Procedures   MEDICATIONS ORDERED IN ED: Medications  lactated ringers  bolus 1,000 mL (1,000 mLs Intravenous New Bag/Given 04/30/24 2233)  ondansetron (ZOFRAN) injection 4 mg (4 mg Intravenous Given 04/30/24 2233)  iohexol (OMNIPAQUE) 300 MG/ML solution 100 mL (100 mLs Intravenous Contrast Given 04/30/24 2248)     IMPRESSION / MDM / ASSESSMENT AND PLAN / ED COURSE  I reviewed the triage vital signs and the nursing notes.                              Differential diagnosis includes, but is not limited to, diverticulitis, colitis, enteritis, chronic pain syndrome, anal fissure  Patient's presentation is most consistent with acute complicated illness / injury requiring diagnostic workup.  Patient is a 43 year old male presenting today for lower abdominal  pain associated with nausea and vomiting.  Slight tenderness palpation of the left lower quadrant is the only prominent aspect of his exam.  Otherwise unremarkable.  Vital signs are stable.  Laboratory workup shows new AKI with no prior history of similar renal function levels of that value.  CBC and lipase otherwise reassuring.  Will get CT to rule out diverticulitis or other acute intra-abdominal infection.  Will also give 1 L of fluids as well as Zofran.  Separately, he was also complaining of burning sensation when he has a bowel movement.  He appears to have a questionable anal fissure which we will treat with topical medications.  CT abdomen/pelvis shows no acute pathology.  Patient safer discharge at this time will send home with topical medications as well as Zofran.  Given GI follow-up.  Given PCP follow-up to recheck his kidney function which is likely from dehydration.     FINAL CLINICAL IMPRESSION(S) / ED DIAGNOSES   Final diagnoses:  Lower abdominal pain  Anal fissure  Dehydration     Rx / DC Orders   ED Discharge Orders          Ordered    hydrocortisone cream 0.5 %  2 times daily        04/30/24 2231    Ambulatory referral to Gastroenterology        04/30/24 2231    Ambulatory Referral to Primary Care (Establish Care)        04/30/24 2309    ondansetron (ZOFRAN) 4 MG tablet  Every 8 hours PRN        04/30/24 2309             Note:  This document was prepared using Dragon voice recognition software and may include unintentional dictation errors.   Kandee Orion, MD 04/30/24 2231    Kandee Orion, MD 04/30/24 [phone]

## 2024-04-30 NOTE — ED Provider Notes (Incomplete)
-----------------------------------------   11:09 PM on 04/30/2024 -----------------------------------------  Assuming care from Dr. Karlynn Oyster.  In short, Darryl Flynn is a 43 y.o. male with a chief complaint of abdominal pain for about a year.  Refer to the original H&P for additional details.  The current plan of care is to follow up on CT results.  Anticipate discharge with outpatient follow up.       Medications  lactated ringers  bolus 1,000 mL (1,000 mLs Intravenous New Bag/Given 04/30/24 2233)  ondansetron (ZOFRAN) injection 4 mg (4 mg Intravenous Given 04/30/24 2233)  iohexol (OMNIPAQUE) 300 MG/ML solution 100 mL (100 mLs Intravenous Contrast Given 04/30/24 2248)     ED Discharge Orders          Ordered    hydrocortisone cream 0.5 %  2 times daily        04/30/24 2231    Ambulatory referral to Gastroenterology        04/30/24 2231    Ambulatory Referral to Primary Care (Establish Care)        04/30/24 2309           Final diagnoses:  Lower abdominal pain  Anal fissure  Dehydration

## 2024-04-30 NOTE — ED Triage Notes (Signed)
 POV with CC of lower abd pain that has been ongoing - hx of gastritis and recurrent vomiting. Pt stating he wants referral for a colonoscopy. Regular bowel movements - last bm today.

## 2024-05-01 ENCOUNTER — Other Ambulatory Visit: Payer: Self-pay

## 2024-05-17 ENCOUNTER — Other Ambulatory Visit: Payer: Self-pay

## 2024-07-10 ENCOUNTER — Ambulatory Visit: Payer: Self-pay | Admitting: Nurse Practitioner

## 2024-09-10 ENCOUNTER — Emergency Department
Admission: EM | Admit: 2024-09-10 | Discharge: 2024-09-10 | Disposition: A | Discharge status: Home / Self Care | Payer: Self-pay | Attending: Emergency Medicine | Admitting: Emergency Medicine

## 2024-09-10 ENCOUNTER — Emergency Department: Payer: Self-pay

## 2024-09-10 ENCOUNTER — Other Ambulatory Visit: Payer: Self-pay

## 2024-09-10 DIAGNOSIS — M542 Cervicalgia: Secondary | ICD-10-CM

## 2024-09-10 DIAGNOSIS — X58XXXA Exposure to other specified factors, initial encounter: Secondary | ICD-10-CM | POA: Insufficient documentation

## 2024-09-10 DIAGNOSIS — K219 Gastro-esophageal reflux disease without esophagitis: Secondary | ICD-10-CM | POA: Insufficient documentation

## 2024-09-10 DIAGNOSIS — M5412 Radiculopathy, cervical region: Secondary | ICD-10-CM | POA: Insufficient documentation

## 2024-09-10 DIAGNOSIS — S5001XA Contusion of right elbow, initial encounter: Secondary | ICD-10-CM | POA: Insufficient documentation

## 2024-09-10 MED ORDER — FAMOTIDINE 20 MG PO TABS: 20.0000 mg | ORAL_TABLET | Freq: Every day | ORAL | 0 refills | Status: AC

## 2024-09-10 MED ORDER — DEXAMETHASONE SODIUM PHOSPHATE 10 MG/ML IJ SOLN
10.0000 mg | Freq: Once | INTRAMUSCULAR | Status: AC
  Administered 2024-09-10: 10 mg via INTRAMUSCULAR
  Filled 2024-09-10: qty 1

## 2024-09-10 MED ORDER — FAMOTIDINE 20 MG PO TABS
20.0000 mg | ORAL_TABLET | Freq: Once | ORAL | Status: AC
  Administered 2024-09-10: 20 mg via ORAL
  Filled 2024-09-10: qty 1

## 2024-09-10 MED ORDER — PREDNISONE 10 MG PO TABS: ORAL_TABLET | ORAL | 0 refills | Status: DC

## 2024-09-10 NOTE — ED Notes (Signed)
Patient is in imaging

## 2024-09-10 NOTE — ED Notes (Signed)
 See triage note   Presents with pain to left upper arm for about 1 week  Pain starts at his neck and moves into posterior shoulder and upper arm  Denies any injury

## 2024-09-10 NOTE — Discharge Instructions (Addendum)
 Follow-up with Dr. Cleotilde who is on-call for orthopedics if you continue to have problems with your elbow, shoulder or neck.  This is a chronic problem and may return.  A prescription for prednisone  was sent to the pharmacy for you to begin taking 6 tablets today and tapering down by 1 tablet each day.  Also Pepcid  20 mg 1 daily was sent to the pharmacy for you to take as needed for your reflux.  A list of clinics is listed on your discharge papers for a primary care provider.  Please go to the following website to schedule new (and existing) patient appointments:   [email or web address]   The following is a list of primary care offices in the area who are accepting new patients at this time.  Please reach out to one of them directly and let them know you would like to schedule an appointment to follow up on an Emergency Department visit, and/or to establish a new primary care provider (PCP).  There are likely other primary care clinics in the are who are accepting new patients, but this is an excellent place to start:  [facility] Lead physician: Dr Jon Eva [address] [place], [place] [address] [phone]  [facility] Lead Physician: Dr Dorette Loron [address], [place], [place] [address] [phone]  [facility]  Lead Physician: Dr Duwaine Louder [address] [place], [place] [address] [phone]  [facility] Lead Physician: Dr Marolyn Officer [address] [place], [place] [address] [phone]  [facility] Medicine at [facility] Lead Physician: Dr Leita Adie [address] [place], [place], [place] [address] [phone]

## 2024-09-10 NOTE — ED Triage Notes (Signed)
 Pt to ED for R elbow pain and clicking since 1 month Also L neck pain that radiates down L arm since 1 week and yesterday had some numbness to L arm. Pain worse with certain movements.  Pt has hx paroxysmal a fib (noted on medical hx in chart). States has been off thinners 'for years'.

## 2024-09-10 NOTE — ED Provider Notes (Signed)
 [facility] Provider Note    Event Date/Time   First MD Initiated Contact with Patient 09/10/24 [phone]     (approximate)   History   Arm Pain   HPI  Darryl Flynn is a 43 y.o. male   presents to the ED with complaint of pain of his right elbow where he states he injured it 1 month ago.  It continues to click and does not appear to be getting any better.  He also has some pain in his neck with radiation down his left arm causing his left hand to be numb.  Patient states this been going on for several weeks but became worse yesterday.  He has taken some ibuprofen  intermittently without any improvement.  Patient reports that he has a history of A-fib but is not taking blood thinners and has been off of them for years.        Physical Exam   Triage Vital Signs: ED Triage Vitals  Encounter Vitals Group     BP 09/10/24 0916 (!) 124/100     Girls Systolic BP Percentile --      Girls Diastolic BP Percentile --      Boys Systolic BP Percentile --      Boys Diastolic BP Percentile --      Pulse Rate 09/10/24 0916 86     Resp 09/10/24 0916 16     Temp 09/10/24 0916 97.8 F (36.6 C)     Temp Source 09/10/24 0916 Oral     SpO2 09/10/24 0916 93 %     Weight 09/10/24 0916 155 lb (70.3 kg)     Height 09/10/24 0916 5' 9 (1.753 m)     Head Circumference --      Peak Flow --      Pain Score 09/10/24 0914 10     Pain Loc --      Pain Education --      Exclude from Growth Chart --     Most recent vital signs: Vitals:   09/10/24 0916  BP: (!) 124/100  Pulse: 86  Resp: 16  Temp: 97.8 F (36.6 C)  SpO2: 93%     General: Awake, no distress.  Minimal tenderness on palpation of cervical spine posteriorly.  Range of motion is without restriction.  No soft tissue edema or discoloration is noted. CV:  Good peripheral perfusion.  Heart regular rate rhythm at present. Resp:  Normal effort.  Lungs are clear bilaterally. Abd:  No distention.  Other:  Minimal  tenderness on palpation of the cervical spine posteriorly.  No deformity or soft tissue injury present.  Patient is also tender on palpation of the left posterior scapula.  Patient also has tenderness on palpation of the left trapezius muscle.  No edema or discoloration of the skin to the left upper extremity.  Radial pulses present.  Motor or sensory function intact.  Capillary refills less than 3 seconds.  No chest wall pain on palpation.   ED Results / Procedures / Treatments   Labs (all labs ordered are listed, but only abnormal results are displayed) Labs Reviewed - No data to display    RADIOLOGY Right elbow x-ray images were reviewed by myself independent of the radiologist was negative for fracture or dislocation.  Official radiology report is negative. Left shoulder x-ray images were reviewed and interpreted by myself independent of the radiologist was negative for fracture or dislocation.  Official radiology report is negative. Cervical spine x-ray images were  reviewed with some mild degenerative changes present.  Radiology report is negative for fracture.   PROCEDURES:  Critical Care performed:   Procedures   MEDICATIONS ORDERED IN ED: Medications  famotidine  (PEPCID ) tablet 20 mg (20 mg Oral Given 09/10/24 1042)  dexamethasone  (DECADRON ) injection 10 mg (10 mg Intramuscular Given 09/10/24 1114)     IMPRESSION / MDM / ASSESSMENT AND PLAN / ED COURSE  I reviewed the triage vital signs and the nursing notes.   Differential diagnosis includes, but is not limited to, cervical pain, cervical strain, acute on chronic cervical pain with left upper extremity radiculopathy, right elbow pain, contusion, fracture, dislocation, degenerative joint disease.  43 year old male presents to the ED with complaint of right elbow pain for greater than 1 month along with left shoulder pain and cervical pain.  He has taken limited amounts of ibuprofen  without relief and also complains of some  exacerbation of his reflux due to the NSAIDs.  X-ray findings were discussed.  Patient was given Decadron  10 mg IM while in the ED along with a tapering dose of steroids.  Patient was also given a prescription for Pepcid  to take as needed for his reflux.  Also talked to him about obtaining a primary care provider for his other health needs.  He states he is agreeable to do this and a list was given to him.      Patient's presentation is most consistent with acute illness / injury with system symptoms.  FINAL CLINICAL IMPRESSION(S) / ED DIAGNOSES   Final diagnoses:  Cervical pain  Cervical radiculopathy  Contusion of right elbow, initial encounter  Gastroesophageal reflux disease without esophagitis     Rx / DC Orders   ED Discharge Orders          Ordered    predniSONE  (DELTASONE ) 10 MG tablet        09/10/24 1109    famotidine  (PEPCID ) 20 MG tablet  Daily        09/10/24 1109             Note:  This document was prepared using Dragon voice recognition software and may include unintentional dictation errors.   Saunders Shona CROME, PA-C 09/10/24 1429    Willo Dunnings, MD 09/10/24 [phone]

## 2025-01-18 ENCOUNTER — Other Ambulatory Visit: Payer: Self-pay

## 2025-01-18 ENCOUNTER — Encounter: Payer: Self-pay | Admitting: *Deleted

## 2025-01-18 ENCOUNTER — Emergency Department
Admission: EM | Admit: 2025-01-18 | Discharge: 2025-01-18 | Disposition: A | Discharge status: Home / Self Care | Payer: Self-pay | Attending: Emergency Medicine | Admitting: Emergency Medicine

## 2025-01-18 DIAGNOSIS — M5412 Radiculopathy, cervical region: Secondary | ICD-10-CM | POA: Diagnosis present

## 2025-01-18 MED ORDER — PREDNISONE 10 MG PO TABS
ORAL_TABLET | ORAL | 0 refills | Status: AC
  Filled 2025-01-18 – 2025-01-19 (×2): qty 40, 16d supply, fill #0

## 2025-01-18 MED ORDER — METHOCARBAMOL 500 MG PO TABS
500.0000 mg | ORAL_TABLET | Freq: Three times a day (TID) | ORAL | 1 refills | Status: AC | PRN
  Filled 2025-01-18 – 2025-01-19 (×2): qty 20, 7d supply, fill #0

## 2025-01-18 MED ORDER — NAPROXEN 500 MG PO TABS
500.0000 mg | ORAL_TABLET | Freq: Two times a day (BID) | ORAL | 2 refills | Status: AC
  Filled 2025-01-18 – 2025-01-19 (×2): qty 20, 10d supply, fill #0

## 2025-01-18 NOTE — ED Notes (Signed)
 EDP at bedside

## 2025-01-18 NOTE — ED Triage Notes (Signed)
 Pt ambulatory to triage.  Pt has neck pain   no known injury  sx for several months.  Pt states sx worse last 2 days.  No chest pain or sob.  Pt reports pain in upper back   pt alert.   Speech clear.

## 2025-01-18 NOTE — ED Provider Notes (Signed)
 "  [facility] Provider Note    Event Date/Time   First MD Initiated Contact with Patient 01/18/25 2135     (approximate)   History   Torticollis   HPI  Darryl Flynn is a 44 y.o. male who presents with complaints of neck pain which has been ongoing for several months seems to have worsened over the last several days.  He reports over the last month he has had intermittent tingling/numbness in his left arm.  No weakness.  Review of records demonstrates visit on October 25 and September 21 for the same thing     Physical Exam   Triage Vital Signs: ED Triage Vitals  Encounter Vitals Group     BP 01/18/25 2011 122/74     Girls Systolic BP Percentile --      Girls Diastolic BP Percentile --      Boys Systolic BP Percentile --      Boys Diastolic BP Percentile --      Pulse Rate 01/18/25 2011 81     Resp 01/18/25 2011 18     Temp 01/18/25 2011 98.7 F (37.1 C)     Temp Source 01/18/25 2011 Oral     SpO2 01/18/25 2011 96 %     Weight 01/18/25 2010 68.5 kg (151 lb)     Height 01/18/25 2010 1.753 m (5' 9)     Head Circumference --      Peak Flow --      Pain Score 01/18/25 2010 10     Pain Loc --      Pain Education --      Exclude from Growth Chart --     Most recent vital signs: Vitals:   01/18/25 2011  BP: 122/74  Pulse: 81  Resp: 18  Temp: 98.7 F (37.1 C)  SpO2: 96%     General: Awake, no distress.  CV:  Good peripheral perfusion.  Resp:  Normal effort.  Abd:  No distention.  Other:  Normal strength in the upper extremities, sensation is normal.  Warm and well-perfused distally no vertebral tenderness to palpation.  No reported trauma.   ED Results / Procedures / Treatments   Labs (all labs ordered are listed, but only abnormal results are displayed) Labs Reviewed - No data to display   EKG     RADIOLOGY     PROCEDURES:  Critical Care performed:   Procedures   MEDICATIONS ORDERED IN ED: Medications - No  data to display   IMPRESSION / MDM / ASSESSMENT AND PLAN / ED COURSE  I reviewed the triage vital signs and the nursing notes. Patient's presentation is most consistent with exacerbation of chronic illness.  Ongoing issues with cervical radiculopathy, strongly urged him to follow-up with spine surgery given continued symptoms.  Will start him on prednisone  taper, naproxen , Robaxin         FINAL CLINICAL IMPRESSION(S) / ED DIAGNOSES   Final diagnoses:  Cervical radiculopathy     Rx / DC Orders   ED Discharge Orders          Ordered    predniSONE  (DELTASONE ) 10 MG tablet  Daily        01/18/25 2147    naproxen  (NAPROSYN ) 500 MG tablet  2 times daily with meals        01/18/25 2147    methocarbamol  (ROBAXIN ) 500 MG tablet  Every 8 hours PRN        01/18/25 2147    Ambulatory  Referral to Primary Care (Establish Care)        01/18/25 2147             Note:  This document was prepared using Dragon voice recognition software and may include unintentional dictation errors.   Arlander Charleston, MD 01/18/25 2227  "

## 2025-01-19 ENCOUNTER — Other Ambulatory Visit: Payer: Self-pay

## 2025-01-21 ENCOUNTER — Other Ambulatory Visit: Payer: Self-pay

## 2025-01-22 ENCOUNTER — Other Ambulatory Visit: Payer: Self-pay
